# Patient Record
Sex: Female | Born: 1940 | Race: Black or African American | Hispanic: No | Marital: Married | Smoking: Never smoker
Health system: Southern US, Community
[De-identification: ages and names within clinical notes are randomized; demographics above are authoritative.]

## PROBLEM LIST (undated history)

## (undated) ENCOUNTER — Ambulatory Visit: Source: Home / Self Care

## (undated) DIAGNOSIS — R0600 Dyspnea, unspecified: Secondary | ICD-10-CM

## (undated) DIAGNOSIS — E559 Vitamin D deficiency, unspecified: Secondary | ICD-10-CM

## (undated) DIAGNOSIS — M810 Age-related osteoporosis without current pathological fracture: Secondary | ICD-10-CM

## (undated) DIAGNOSIS — D509 Iron deficiency anemia, unspecified: Secondary | ICD-10-CM

## (undated) DIAGNOSIS — I10 Essential (primary) hypertension: Secondary | ICD-10-CM

## (undated) DIAGNOSIS — H269 Unspecified cataract: Secondary | ICD-10-CM

## (undated) DIAGNOSIS — D649 Anemia, unspecified: Secondary | ICD-10-CM

## (undated) DIAGNOSIS — E049 Nontoxic goiter, unspecified: Secondary | ICD-10-CM

## (undated) DIAGNOSIS — E785 Hyperlipidemia, unspecified: Secondary | ICD-10-CM

## (undated) DIAGNOSIS — M79609 Pain in unspecified limb: Secondary | ICD-10-CM

## (undated) DIAGNOSIS — E663 Overweight: Secondary | ICD-10-CM

## (undated) DIAGNOSIS — M542 Cervicalgia: Secondary | ICD-10-CM

## (undated) DIAGNOSIS — N9489 Other specified conditions associated with female genital organs and menstrual cycle: Secondary | ICD-10-CM

## (undated) DIAGNOSIS — R600 Localized edema: Secondary | ICD-10-CM

## (undated) DIAGNOSIS — G4700 Insomnia, unspecified: Secondary | ICD-10-CM

## (undated) DIAGNOSIS — H35039 Hypertensive retinopathy, unspecified eye: Secondary | ICD-10-CM

## (undated) DIAGNOSIS — E7800 Pure hypercholesterolemia, unspecified: Secondary | ICD-10-CM

## (undated) DIAGNOSIS — Z6830 Body mass index (BMI) 30.0-30.9, adult: Secondary | ICD-10-CM

## (undated) HISTORY — DX: Age-related osteoporosis without current pathological fracture: M81.0

## (undated) HISTORY — PX: BREAST BIOPSY: SHX20

## (undated) HISTORY — DX: Overweight: E66.3

## (undated) HISTORY — DX: Hypercalcemia: E83.52

## (undated) HISTORY — PX: REPLACEMENT TOTAL KNEE: SUR1224

## (undated) HISTORY — DX: Hyperlipidemia, unspecified: E78.5

## (undated) HISTORY — DX: Anemia, unspecified: D64.9

## (undated) HISTORY — DX: Pure hypercholesterolemia, unspecified: E78.00

## (undated) HISTORY — DX: Localized edema: R60.0

## (undated) HISTORY — DX: Vitamin D deficiency, unspecified: E55.9

## (undated) HISTORY — DX: Pain in unspecified limb: M79.609

## (undated) HISTORY — DX: Dyspnea, unspecified: R06.00

## (undated) HISTORY — DX: Unspecified cataract: H26.9

## (undated) HISTORY — DX: Nontoxic goiter, unspecified: E04.9

## (undated) HISTORY — DX: Iron deficiency anemia, unspecified: D50.9

## (undated) HISTORY — PX: OTHER SURGICAL HISTORY: SHX169

## (undated) HISTORY — DX: Hypertensive retinopathy, unspecified eye: H35.039

## (undated) HISTORY — DX: Cervicalgia: M54.2

## (undated) HISTORY — DX: Other specified conditions associated with female genital organs and menstrual cycle: N94.89

## (undated) HISTORY — DX: Body mass index (BMI) 30.0-30.9, adult: Z68.30

## (undated) HISTORY — PX: DILATION AND CURETTAGE OF UTERUS: SHX78

## (undated) HISTORY — PX: APPENDECTOMY: SHX54

## (undated) HISTORY — DX: Insomnia, unspecified: G47.00

---

## 1998-03-12 ENCOUNTER — Other Ambulatory Visit: Admission: RE | Admit: 1998-03-12 | Discharge: 1998-03-12 | Payer: Self-pay | Admitting: Obstetrics and Gynecology

## 1998-10-02 ENCOUNTER — Ambulatory Visit (HOSPITAL_COMMUNITY): Admission: RE | Admit: 1998-10-02 | Discharge: 1998-10-02 | Payer: Self-pay | Admitting: Rheumatology

## 1999-03-28 ENCOUNTER — Encounter (HOSPITAL_BASED_OUTPATIENT_CLINIC_OR_DEPARTMENT_OTHER): Payer: Self-pay | Admitting: General Surgery

## 1999-03-28 ENCOUNTER — Ambulatory Visit (HOSPITAL_COMMUNITY): Admission: RE | Admit: 1999-03-28 | Discharge: 1999-03-28 | Payer: Self-pay | Admitting: General Surgery

## 1999-10-23 ENCOUNTER — Encounter (HOSPITAL_BASED_OUTPATIENT_CLINIC_OR_DEPARTMENT_OTHER): Payer: Self-pay | Admitting: General Surgery

## 1999-10-23 ENCOUNTER — Ambulatory Visit (HOSPITAL_COMMUNITY): Admission: RE | Admit: 1999-10-23 | Discharge: 1999-10-23 | Payer: Self-pay | Admitting: General Surgery

## 2001-01-11 ENCOUNTER — Inpatient Hospital Stay (HOSPITAL_COMMUNITY): Admission: AD | Admit: 2001-01-11 | Discharge: 2001-01-16 | Payer: Self-pay | Admitting: Orthopedic Surgery

## 2001-01-11 ENCOUNTER — Encounter: Payer: Self-pay | Admitting: Orthopedic Surgery

## 2001-04-19 ENCOUNTER — Ambulatory Visit (HOSPITAL_COMMUNITY): Admission: RE | Admit: 2001-04-19 | Discharge: 2001-04-19 | Payer: Self-pay | Admitting: Endocrinology

## 2001-04-19 ENCOUNTER — Encounter: Payer: Self-pay | Admitting: Endocrinology

## 2001-05-17 ENCOUNTER — Ambulatory Visit (HOSPITAL_BASED_OUTPATIENT_CLINIC_OR_DEPARTMENT_OTHER): Admission: RE | Admit: 2001-05-17 | Discharge: 2001-05-18 | Payer: Self-pay | Admitting: Orthopedic Surgery

## 2002-01-21 ENCOUNTER — Other Ambulatory Visit: Admission: RE | Admit: 2002-01-21 | Discharge: 2002-01-21 | Payer: Self-pay | Admitting: Obstetrics and Gynecology

## 2002-11-21 ENCOUNTER — Inpatient Hospital Stay (HOSPITAL_COMMUNITY): Admission: RE | Admit: 2002-11-21 | Discharge: 2002-11-28 | Payer: Self-pay | Admitting: Orthopedic Surgery

## 2002-11-21 ENCOUNTER — Encounter: Payer: Self-pay | Admitting: Orthopedic Surgery

## 2003-02-06 ENCOUNTER — Ambulatory Visit (HOSPITAL_BASED_OUTPATIENT_CLINIC_OR_DEPARTMENT_OTHER): Admission: RE | Admit: 2003-02-06 | Discharge: 2003-02-06 | Payer: Self-pay | Admitting: Orthopedic Surgery

## 2003-04-06 ENCOUNTER — Other Ambulatory Visit: Admission: RE | Admit: 2003-04-06 | Discharge: 2003-04-06 | Payer: Self-pay | Admitting: Obstetrics and Gynecology

## 2004-08-13 ENCOUNTER — Other Ambulatory Visit: Admission: RE | Admit: 2004-08-13 | Discharge: 2004-08-13 | Payer: Self-pay | Admitting: Obstetrics and Gynecology

## 2005-10-02 ENCOUNTER — Ambulatory Visit: Payer: Self-pay | Admitting: Obstetrics and Gynecology

## 2005-10-03 ENCOUNTER — Ambulatory Visit (HOSPITAL_COMMUNITY): Admission: RE | Admit: 2005-10-03 | Discharge: 2005-10-03 | Payer: Self-pay | Admitting: *Deleted

## 2006-08-07 ENCOUNTER — Ambulatory Visit: Payer: Self-pay | Admitting: Gynecology

## 2008-06-16 ENCOUNTER — Encounter: Admission: RE | Admit: 2008-06-16 | Discharge: 2008-06-16 | Payer: Self-pay | Admitting: Rheumatology

## 2010-05-29 ENCOUNTER — Emergency Department (HOSPITAL_COMMUNITY): Admission: EM | Admit: 2010-05-29 | Discharge: 2010-05-29 | Payer: Self-pay | Admitting: Emergency Medicine

## 2010-09-11 ENCOUNTER — Other Ambulatory Visit: Admission: RE | Admit: 2010-09-11 | Discharge: 2010-09-11 | Payer: Self-pay | Admitting: Obstetrics and Gynecology

## 2011-03-14 NOTE — Group Therapy Note (Signed)
NAME:  Gloria Whitney, Gloria Whitney NO.:  [id number]   MEDICAL RECORD NO.:  [id number]          PATIENT TYPE:  WOC   LOCATION:  [facility]                   FACILITY:  [facility]   PHYSICIAN:  Argentina Donovan, MD        DATE OF BIRTH:  03/13/1941   DATE OF SERVICE:                                    CLINIC NOTE   HISTORY OF PRESENT ILLNESS:  The patient is a 70 year old, very pleasant  black female who is about 10 years' postmenopausal who went in to the health  department for her annual Pap in a program that they have there, and they  told her that she had a uterus of about 16 weeks' size, and in taking the  history, they told her before when she was seeing a private doctor that the  fibroids were no longer than a thumbnail.  She has been seen on a regular  basis by her own private physicians so we are going and try and get those  records.  It is very possible that they have told her that the uterus was  not growing in the past and that it has been enlarged for many years.  We  will hope to obtain that from the records as well as getting an ultrasound  to evaluate what that shows.  If, in fact, the uterus was small 10 years'  post menopausally, and it has grown to this size then we are significantly  concerned, and we will probably refer the patient to GYN oncologist for  further evaluation.  I have told the patient when the ultrasound and records  from her private physician return we will go ahead and give her a call.           ______________________________  Argentina Donovan, MD     PR/MEDQ  D:  10/02/2005  T:  10/03/2005  Job:  [id number]

## 2011-03-14 NOTE — Discharge Summary (Signed)
[facility]  Patient:    Gloria Whitney, Gloria Whitney                   MRN: [id number] Adm. Date:  [id number] Disc. Date: [id number] Attending:  Milly Jakob Dictator:   Currie Paris. Thedore Mins. CC:         Demetria Pore. Coral Spikes, M.D.   Discharge Summary  ADMISSION DIAGNOSES: 1. Degenerative joint disease right knee. 2. Hypertension.  DISCHARGE DIAGNOSES: 1. Degenerative joint disease right knee. 2. Hypertension. 3. Postoperative blood loss anemia.  PROCEDURES:  Right total knee arthroplasty, Milly Jakob, M.D., January 11, 2001.  HISTORY OF PRESENT ILLNESS:  Gloria Whitney is a pleasant 70 year old female who has a long history of right knee pain.  She has pain with ambulation and positive night pain.  X-rays of the right knee, which are weightbearing films, showed bone-on-bone changes in the lateral compartment.  She got temporary relief with a knee arthroscopy but had persistent pain in her knee despite use of anti-inflammatory medication and cortisone injections.  Since she has not improved with exhaustive conservative management, she was admitted for a right total knee arthroplasty.  LABORATORY DATA:  Hematocrit on admission 33.4, WBC was 4.1.  On postoperative day #1 hemoglobin was 7.9; on postoperative day #2, 8.0; on postoperative day #3, 8.1; on postoperative day #4, 7.9; on postoperative day #5, 8.3.  Protime on admission was 14.68 with an INR of 1.3.  On the date of discharge protime was 17.3 seconds with an INR of 1.7 on Coumadin antithrombolytic therapy. CMET on admission was within normal limits.  One unit of autologous blood was transfused during this hospitalization.  HOSPITAL COURSE:  The patient was admitted to [facility] on January 11, 2001, and brought to the operating room where she underwent a right total knee arthroplasty, as well described in Dr. Luiz Blare operative note. Postoperatively the patient was placed on Ancef 1 g IV q.8h.  x 48 hours, put on a PCA morphine pump for pain control.  Physical therapy was also ordered for walker ambulation, weightbearing as tolerated on the right side.  CPM machine was used for range of motion.  On postoperative day #1 she had no complaints.  Foley catheter had been placed at the time of surgery and was removed.  She had not gotten out of bed yet.  She was using minimal PCA morphine pump.  Her vital signs were stable.  The patient was alert and oriented.  Her hemoglobin was 7.9.  Her protime was 14.68 with an INR of 1.3. She was felt to have postoperative blood loss anemia, and one unit of autologous blood was transfused.  The patient was seen by physical therapy. Her Norvasc was held until her blood pressure increased.  She was continued on IV fluids, PCA morphine pump, and Coumadin antithrombolytic therapy per pharmacy protocol.  We watched her hemoglobin carefully.  On postoperative day #2 her hemoglobin was 8.0.  Her INR was 1.4.  ______  was getting along quite well.  She had no dizziness when up.  She was put on oral iron therapy.  Her dressing was changed, and her Hemovac drain was pulled.  Her IV and PCA morphine pump were discontinued.  She continued to progress without difficulties.  We watched her hemoglobin carefully and, on the date of discharge, her hemoglobin was 8.3.  Her vital signs were stable, and she was afebrile.  Her right knee wound was benign.  Her calf  was soft.  She was discharged home.  She was given a prescription for Percocet p.r.n. for pain, 1-2 q.6h., with no refills.  Coumadin per pharmacy protocol x 1 month postoperatively.  She will get home health physical therapy and home health R.N. blood draws for protimes.  She will follow up with Dr. Luiz Blare in 10 days in the office, sooner if any problems occur.DD:  02/17/01 TD:  02/19/01 Job: [id number]

## 2011-03-14 NOTE — Op Note (Signed)
   NAME:  Gloria Whitney, Gloria Whitney                      ACCOUNT NO.:  [id number]   MEDICAL RECORD NO.:  [id number]                   PATIENT TYPE:  AMB   LOCATION:  [facility]                                  FACILITY:  [facility]   PHYSICIAN:  Harvie Junior, M.D.                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   PROCEDURE:  Left total knee manipulation.   SURGEON:  Harvie Junior, M.D.   ASSISTANT:  None.   ANESTHESIA:  General.   HISTORY OF PRESENT ILLNESS:  The patient is a 70 year old female with a  history of having had a knee replacement followed by knee revision. She  ultimately ended up having problems with stiffness postoperatively.  Ultimately, we attempted a manipulation on her and she was brought to the  operating room for the procedure.   DESCRIPTION OF PROCEDURE:  The patient was brought to the operating room and  after adequate anesthesia was obtained with general anesthetic, the patient  was placed on the operating table. The left leg was then attempted to be  manipulated.  We ultimately were able to manipulate her from about 70 to  about 85 degrees, but really did not feel the breakup of scar tissue.  It  was more of a springy manipulation and the improvements were really pretty  minimal. At that point we tried to assess where the tightness was.  It  seemed to really be more anterior than anywhere.  We had concerns of ripping  the patellar tendon and had concerns over attempting to go too aggressively  with this and at this point felt that our only options were to abandon  further attempts at manipulation.  The patient was then allowed to awaken  and taken to the recovery room where she was noted to be in satisfactory  condition.  Estimated  blood loss for the procedure was none.  The knee was  instilled with 20 mL of 1/2% Marcaine postoperatively.                                               Harvie Junior, M.D.    Ranae Plumber  D:  02/06/2003  T:  02/06/2003  Job:  [id number]

## 2011-03-14 NOTE — Op Note (Signed)
NAME:  Gloria Whitney, Gloria Whitney                      ACCOUNT NO.:  [id number]   MEDICAL RECORD NO.:  [id number]                   PATIENT TYPE:  INP   LOCATION:  NA                                   FACILITY:  [facility]   PHYSICIAN:  Harvie Junior, M.D.                DATE OF BIRTH:  1941/04/12   DATE OF PROCEDURE:  11/21/2002  DATE OF DISCHARGE:                                 OPERATIVE REPORT   PREOPERATIVE DIAGNOSIS:  Painful right total knee with stiffness and concern  for looseness based on radiographic findings and bone scan findings.   POSTOPERATIVE DIAGNOSIS:  1. Loose tibial component.  2. Tightness of flexion with a severe amount of synovial tissue and     tightness of the extensor mechanism.   PROCEDURE:  1. Revision of tibial component, right femur.  2. Revision of patellar component, right femur.  3. Repair of extensor mechanism by way of suture anchor on the patellar     tendon.   SURGEON:  Harvie Junior, M.D.   ASSISTANT:  Marshia Ly, P.A.   ANESTHESIA:  General.   INDICATIONS:  The patient is a 70 year old female with a long history of  having  had a right total knee replacement for degenerative changes in the  knee. She ultimately initially did well. Postoperatively she had some  injuries about the knee but ultimately became increasingly stiff and  increasingly painful about the knee. Because of continued complaints of pain  and stiffness, the patient ultimately underwent a bone scan. The bone scan  suggested that the tibial component was loose as well as questioning the  situation of the femoral component.   After discussion of the possibility of the issues related to the knee, the  patient ultimately elected to undergo a revision of the knee as needed and  she was brought to the operating room for this procedure. During the period  of time when we had discussed revision and the actual surgery, the patient  began having  increasing stiffness, and ultimately  by the time of surgery  had a range of motion of about 5 to 50 degrees, and this was noted prior to  the time of her revision. The patient is brought to the operating room for a  total knee revision as needed.   DESCRIPTION OF PROCEDURE:  The patient was brought to the operating room.  After adequate general anesthesia was obtained with a general anesthetic the  patient was placed on the operating table and the right leg was prepped and  draped in the usual sterile fashion. Following Esmarch exsanguination, the  tourniquet was placed and inflated to 350 mmHg.   Following this an incision was made, an excision of her old scar and this  incision was continued down to the extensor mechanism. The extensor  mechanism was then clearly identified, and a medial parapatellar arthrotomy  was undertaken. At this point there  was a tremendous amount of scar tissue  socking in the patellar tendon, socking in the medial and lateral sides of  the component, and it was felt that at this point the most appropriate  course of action was going to be radical synovectomy.   This was started proximally on both the medial and lateral side, and a  massive synovectomy was performed. There was still inability to evert the  patella at this point, and attention was turned towards continuing to do  this. Ultimately a pin was put in place to hold the extensor mechanism and  the patella was ultimately everted and then flexed up. The extensor  mechanism then did rip slightly at the insertion area of this pin. The  extensor mechanism did not come disconnected, but it was somewhat moved by  the insertion site of this pin.   At this point the tibial component was assessed. It did not appear to be  grossly loose, but there was some wobble, given the bone scan findings and  the minimal looseness. Ultimately an osteotome was placed under the side and  then the tibial component did come quickly loose.   At this point  attention was turned towards the femoral side where a femoral  inserter was put in place around the femoral component. It was rocked, it  was slapped and did not appear to be loose at all.   At this point attention was turned to the tibia where the tibial shaft was  sequentially reamed up to a level of 20. Conical reaming was then  undertaken. Following this attention was turned toward sizing the tibial  component. It was between a 3 and a 4, but ultimately felt a 3 was going to  be the most appropriate area, and obviously the stem was going to dictate  the location of the tibial component at any rate.   At this point attention was turned towards placement of the trial tibia. It  was reamed and then conically reamed and then the component was put in  place. The trial of 15 mm insert was put in place and then the patella was  left in place. A range of motion was undertaken and there was really  significant tightness in terms of the extensor mechanism at this point. It  was felt that we could gain 4 to 5 mm of space by changing to an all poly  patella and at this point the decision was made to revise the patellar  component.   An osteotome was used to get the patellar component removed from the patella  and this was then free hand cut down to a level of 13 mm, and then free hand  drilled for a 35 mm patella. Range of motion was significantly improved by  this, but still there was tightness of the extensor mechanism.   At this point attention was turned towards the extensor mechanism, where a  radical lateral retinacular release was performed. This improved the flexion  significantly and the tightness was relieved significantly by this.   At this point all the components were removed. It was copiously irrigated  and suctioned dry with pulsatile lavage irrigation. Stat Gram stains had been sent during the case and were not available at this point, but there  did not appear to be any  evidence of gross infection.   At this point we called down to the laboratory prior to  implantation and it  turned out that the labs had  not been sent off as requested in a stat  fashion and it was clear they were not going to be available prior to the  end of this case. We felt at this point it was appropriate to cement the  tibial component and patellar component in as there were no gross signs of  infection.   The femoral and tibial components were then cemented into place. A 20 mm  stemmed tibial component as well as a conical portion with a tray and a 15  mm insert. The patella was drilled and cemented and a 35 mm patellar button.  The leg was put through a range of motion at this point and felt to be  stable in all directions. It did come easily at this point to gravity 115  degrees of flexion.  A radical synovectomy was again checked and it appeared as tough the  synovial proliferation had been addressed.   At this point the leg was copiously irrigated. The lateral retinacular  release was performed. The tourniquet was let down at this time. All  bleeding was controlled with electrocautery.   Attention was then turned towards closure. In 90 degrees of flexion the  extensor mechanism was closed both proximally and distally. The skin was  then closed with a combination of 0 and 2-0 Vicryl. A medium Hemovac drain  had been previously  put in place and was hooked up, and the leg was put  through a range of motion at this point with easy 115 degrees of gravity  flexion and easily came out to full extension.   At this point attention was turned towards the extensor mechanism in the  area where the extensor mechanism had torn loose. Any repair  of the  extensor mechanism was undertaken with a suture anchor. A 3.5 mm suture  anchor with a super quick attachment was used and the patellar tendon was  repaired in place. This was done with the knee in 90 degrees of flexion and  no  tendency towards tension on the patellar tendon at this point.   At this point again the knee was copiously irrigated and suctioned dry. The  Hemovac drain was hooked up and the closure was taken in flexion of the  medial parapatellar arthrotomy and the skin, and the staples were applied.   The patient was then taken to the recovery room where she was noted to be in  satisfactory condition after a sterile compressive dressing had been applied  and a knee immobilizer. The estimated blood loss for the case was 200 cc.                                               Harvie Junior, M.D.    Ranae Plumber  D:  11/21/2002  T:  11/21/2002  Job:  [id number]

## 2011-03-14 NOTE — Discharge Summary (Signed)
NAME:  Gloria Whitney, Gloria Whitney                      ACCOUNT NO.:  [id number]   MEDICAL RECORD NO.:  [id number]                   PATIENT TYPE:  INP   LOCATION:  5010                                 FACILITY:  [facility]   PHYSICIAN:  Harvie Junior, M.D.                DATE OF BIRTH:  05-25-1941   DATE OF ADMISSION:  11/21/2002  DATE OF DISCHARGE:  11/28/2002                                 DISCHARGE SUMMARY   ADMISSION DIAGNOSES:  1. Aseptic loosening of tibial component, right knee, status post right     total knee arthroplasty.  2. Hypertension.   DISCHARGE DIAGNOSES:  1. 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  2. Hypertension.  3. Postoperative blood loss anemia.   PROCEDURES:  Right total knee revision with revision of tibial component and  patellar component with extensive synovectomy/scar tissue removal.   CONSULTATIONS:  [facility] consult with Francisca December, M.D.   HISTORY OF PRESENT ILLNESS:  The patient is a 70 year old female who  underwent a right total knee replacement in 2002.  Postoperatively she did  relatively well and was able to function and get back to work, but had  worsening pain and swelling in her right knee.  She had pain with  ambulation.  She had plain x-rays which showed questionable tibial component  loosening radiographically.  We obtained a bone scan of the right knee which  showed increased uptake in the knee which was felt to be indicative of  probable loosening of her tibial component.  Her sedimentation rate was 31  and her CPR was 0.2.  We modified her activity and treated her with  medication, but she continued to have problems with pain in her knee and was  admitted for right total knee revision as she failed to respond to  exhaustive conservative treatment.   LABORATORY DATA:  The patient's EKG on admission showed normal sinus rhythm  with  nonspecific T-wave abnormality.  Preoperative chest x-ray showed stable  chest with no active findings.  The patient's hemoglobin on admission was  12.4 with a hematocrit of 36.2.  The hemoglobin on postoperative day #1 was  8.4.  On postoperative day #2, it was 10.2.  On postoperative day #3, it was  8.8.  On postoperative day #5, it was 8.4.  Her indices were within normal  limits.  The pro time on admission was 12 seconds with an INR of 0.8 and a  PTT of 30.  On the day of discharge, her pro time was 19.1 seconds with an  INR of 1.7.  The CMET on admission was within normal limits, although she  had a slightly elevated AST at 40.  The BMET on postoperative day #1 showed  a sodium of 132 and a glucose of 123, otherwise within normal limits.  The  urinalysis on admission  showed no abnormalities.  Cultures from the right  knee showed no growth in two days.  Gram's stain showed no organisms.  There  were no anaerobes isolated.  AFB culture and smears showed no acid-fast  bacilli seen.  The patient was transfused two units of packed rbc's during  this hospitalization for decreased hemoglobin.   HOSPITAL COURSE:  The patient underwent right total knee revision as well  described in Dr. Harvie Junior' operative note on November 21, 2002.  Postoperatively she was put on Ancef 1 g IV q.8h. x 6 doses.  A PCA morphine  pump was used for pain control.  She was also started on physical therapy  and a CPM machine was used for her right knee to maintain an increased range  of motion.  On postoperative day #1, she had a somewhat low blood pressure  at 99/65.  Her fluid was increased up.  She was anemic with a hemoglobin of  8.4 and a hematocrit of 24.8.  Her INR was 1.1.  Two units of packed red  blood cells were transfused.  Her Foley catheter which had been placed at  the time of surgery was maintained.  An [facility] consult was  obtained on November 22, 2002.  They felt that she had nonanginal  epigastric  and abdominal pain.  Hypertension was controlled.  They felt that the  hypotension may be related to the blood loss.  They did not feel there was  any sort of cardiac event which required significant treatment.  On  postoperative day #2, she had no complaints.  She had minimal knee pain.  Her Foley catheter was removed.  She had not gotten out of bed.  She had no  dizziness or chest pain.  She had a temperature of 101.7 degrees and her  vital signs were stable.  Her hemoglobin was 10.2 and her INR was 1.5.  Her  dressing was changed to the right knee and her Hemovac drains were pulled.  Her PCA morphine pump was left an additional day to help control her pain.  On postoperative day #3, she had a temperature up to 103.2 degrees and then  was found to be afebrile.  Her knee wound was benign.  Her calf was soft  with no sign of DVT.  A rehabilitation consult was obtained, but her  insurance did not cover this, so this was aborted.  On postoperative day #4,  she had a sensation of feeling weak.  She had some mild rectal bleeding on  her toilet.  This was not felt to be significant.  Lovenox was added at 30  mg subcu b.i.d. as her INR was only 1.3.  She was continued with physical  therapy and the use of the CPM machine.  She made somewhat slow progress  with physical therapy and was still able to get home and be independent  until November 28, 2002.  At that point, she continued to feel weak, but she  was overall ready for discharge.  Her vital signs were stable.  She was  afebrile.  Her right knee wound was benign.  Her calf was soft.  Her  neurovascular status was back distally.   DISPOSITION:  She was discharged home in improved condition.   DIET:  She was on a regular diet.   DISCHARGE MEDICATIONS:  We will continue her usual home medications with the  additional of Percocet 5 mg p.r.n. for pain and Coumadin antithrombolytic therapy per  pharmacy protocol x 1 month  postoperatively.   ACTIVITY:  She will ambulate with weightbearing as tolerated on the right  with a walker.  She will bring home a CPM machine and get home health  physical therapy as well.   FOLLOW-UP:  She will follow up with Harvie Junior, M.D., in one week in the  office.  She will call sooner if any problems occur.     Marshia Ly, P.A.                       Harvie Junior, M.D.    Cordelia Pen  D:  12/28/2002  T:  12/28/2002  Job:  [id number]   cc:   Demetria Pore. Coral Spikes, M.D.  [address]  [address]  [place]  [place] [address]  Fax: [phone]   Francisca December, M.D.  [address]  [address]  [place]  [place] [address]  Fax: [phone]
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
## 2011-09-04 DIAGNOSIS — Z96659 Presence of unspecified artificial knee joint: Secondary | ICD-10-CM | POA: Insufficient documentation

## 2012-10-15 ENCOUNTER — Other Ambulatory Visit (HOSPITAL_COMMUNITY)
Admission: RE | Admit: 2012-10-15 | Discharge: 2012-10-15 | Disposition: A | Discharge status: Home / Self Care | Payer: Medicare Other | Source: Ambulatory Visit | Attending: Obstetrics and Gynecology | Admitting: Obstetrics and Gynecology

## 2012-10-15 ENCOUNTER — Other Ambulatory Visit: Payer: Self-pay | Admitting: Obstetrics and Gynecology

## 2012-10-15 DIAGNOSIS — Z124 Encounter for screening for malignant neoplasm of cervix: Secondary | ICD-10-CM | POA: Insufficient documentation

## 2013-11-23 ENCOUNTER — Other Ambulatory Visit: Payer: Self-pay | Admitting: Obstetrics and Gynecology

## 2013-11-23 DIAGNOSIS — D259 Leiomyoma of uterus, unspecified: Secondary | ICD-10-CM

## 2013-11-29 ENCOUNTER — Other Ambulatory Visit: Payer: Medicare Other

## 2015-01-24 DIAGNOSIS — M810 Age-related osteoporosis without current pathological fracture: Secondary | ICD-10-CM | POA: Insufficient documentation

## 2015-01-24 DIAGNOSIS — M4850XA Collapsed vertebra, not elsewhere classified, site unspecified, initial encounter for fracture: Secondary | ICD-10-CM | POA: Insufficient documentation

## 2015-01-24 DIAGNOSIS — Z9181 History of falling: Secondary | ICD-10-CM | POA: Insufficient documentation

## 2015-01-24 DIAGNOSIS — R29890 Loss of height: Secondary | ICD-10-CM | POA: Insufficient documentation

## 2015-01-24 DIAGNOSIS — R42 Dizziness and giddiness: Secondary | ICD-10-CM | POA: Insufficient documentation

## 2015-07-23 ENCOUNTER — Other Ambulatory Visit: Payer: Self-pay | Admitting: Internal Medicine

## 2015-07-23 DIAGNOSIS — R42 Dizziness and giddiness: Secondary | ICD-10-CM

## 2015-07-24 ENCOUNTER — Ambulatory Visit
Admission: RE | Admit: 2015-07-24 | Discharge: 2015-07-24 | Disposition: A | Discharge status: Home / Self Care | Payer: Medicare Other | Source: Ambulatory Visit | Attending: Internal Medicine | Admitting: Internal Medicine

## 2015-07-24 DIAGNOSIS — R42 Dizziness and giddiness: Secondary | ICD-10-CM

## 2016-08-13 DIAGNOSIS — Z8781 Personal history of (healed) traumatic fracture: Secondary | ICD-10-CM | POA: Insufficient documentation

## 2017-12-30 DIAGNOSIS — M1712 Unilateral primary osteoarthritis, left knee: Secondary | ICD-10-CM | POA: Insufficient documentation

## 2018-02-16 DIAGNOSIS — Z5181 Encounter for therapeutic drug level monitoring: Secondary | ICD-10-CM | POA: Insufficient documentation

## 2019-02-10 ENCOUNTER — Other Ambulatory Visit: Payer: Self-pay

## 2019-02-10 ENCOUNTER — Emergency Department (HOSPITAL_COMMUNITY): Payer: Medicare Other

## 2019-02-10 ENCOUNTER — Encounter (HOSPITAL_COMMUNITY): Payer: Self-pay

## 2019-02-10 ENCOUNTER — Inpatient Hospital Stay (HOSPITAL_COMMUNITY)
Admission: EM | Admit: 2019-02-10 | Discharge: 2019-02-13 | DRG: 418 | Disposition: A | Discharge status: Home Health | Payer: Medicare Other | Attending: General Surgery | Admitting: General Surgery

## 2019-02-10 DIAGNOSIS — Z9851 Tubal ligation status: Secondary | ICD-10-CM | POA: Diagnosis not present

## 2019-02-10 DIAGNOSIS — Z79899 Other long term (current) drug therapy: Secondary | ICD-10-CM

## 2019-02-10 DIAGNOSIS — Z888 Allergy status to other drugs, medicaments and biological substances status: Secondary | ICD-10-CM

## 2019-02-10 DIAGNOSIS — K810 Acute cholecystitis: Secondary | ICD-10-CM | POA: Diagnosis present

## 2019-02-10 DIAGNOSIS — I10 Essential (primary) hypertension: Secondary | ICD-10-CM | POA: Diagnosis present

## 2019-02-10 DIAGNOSIS — Z419 Encounter for procedure for purposes other than remedying health state, unspecified: Secondary | ICD-10-CM

## 2019-02-10 DIAGNOSIS — E871 Hypo-osmolality and hyponatremia: Secondary | ICD-10-CM | POA: Diagnosis present

## 2019-02-10 DIAGNOSIS — N179 Acute kidney failure, unspecified: Secondary | ICD-10-CM | POA: Diagnosis present

## 2019-02-10 DIAGNOSIS — R1011 Right upper quadrant pain: Secondary | ICD-10-CM | POA: Diagnosis present

## 2019-02-10 DIAGNOSIS — Z96659 Presence of unspecified artificial knee joint: Secondary | ICD-10-CM | POA: Diagnosis present

## 2019-02-10 HISTORY — DX: Essential (primary) hypertension: I10

## 2019-02-10 LAB — CBC WITH DIFFERENTIAL/PLATELET
Abs Immature Granulocytes: 0.1 10*3/uL — ABNORMAL HIGH (ref 0.00–0.07)
Basophils Absolute: 0 10*3/uL (ref 0.0–0.1)
Basophils Relative: 0 %
Eosinophils Absolute: 0 10*3/uL (ref 0.0–0.5)
Eosinophils Relative: 0 %
HCT: 35.9 % — ABNORMAL LOW (ref 36.0–46.0)
Hemoglobin: 12.1 g/dL (ref 12.0–15.0)
Immature Granulocytes: 1 %
Lymphocytes Relative: 7 %
Lymphs Abs: 1 10*3/uL (ref 0.7–4.0)
MCH: 31.7 pg (ref 26.0–34.0)
MCHC: 33.7 g/dL (ref 30.0–36.0)
MCV: 94 fL (ref 80.0–100.0)
Monocytes Absolute: 1.6 10*3/uL — ABNORMAL HIGH (ref 0.1–1.0)
Monocytes Relative: 11 %
Neutro Abs: 11.9 10*3/uL — ABNORMAL HIGH (ref 1.7–7.7)
Neutrophils Relative %: 81 %
Platelets: 189 10*3/uL (ref 150–400)
RBC: 3.82 MIL/uL — ABNORMAL LOW (ref 3.87–5.11)
RDW: 12.1 % (ref 11.5–15.5)
WBC: 14.6 10*3/uL — ABNORMAL HIGH (ref 4.0–10.5)
nRBC: 0 % (ref 0.0–0.2)

## 2019-02-10 LAB — COMPREHENSIVE METABOLIC PANEL
ALT: 60 U/L — ABNORMAL HIGH (ref 0–44)
AST: 63 U/L — ABNORMAL HIGH (ref 15–41)
Albumin: 3.5 g/dL (ref 3.5–5.0)
Alkaline Phosphatase: 77 U/L (ref 38–126)
Anion gap: 12 (ref 5–15)
BUN: 14 mg/dL (ref 8–23)
CO2: 23 mmol/L (ref 22–32)
Calcium: 9.6 mg/dL (ref 8.9–10.3)
Chloride: 92 mmol/L — ABNORMAL LOW (ref 98–111)
Creatinine, Ser: 1.35 mg/dL — ABNORMAL HIGH (ref 0.44–1.00)
GFR calc Af Amer: 44 mL/min — ABNORMAL LOW (ref >60)
GFR calc non Af Amer: 38 mL/min — ABNORMAL LOW (ref >60)
Glucose, Bld: 122 mg/dL — ABNORMAL HIGH (ref 70–99)
Potassium: 3.5 mmol/L (ref 3.5–5.1)
Sodium: 127 mmol/L — ABNORMAL LOW (ref 135–145)
Total Bilirubin: 1.3 mg/dL — ABNORMAL HIGH (ref 0.3–1.2)
Total Protein: 7.5 g/dL (ref 6.5–8.1)

## 2019-02-10 LAB — URINALYSIS, ROUTINE W REFLEX MICROSCOPIC
Bacteria, UA: NONE SEEN
Bilirubin Urine: NEGATIVE
Glucose, UA: NEGATIVE mg/dL
Ketones, ur: NEGATIVE mg/dL
Leukocytes,Ua: NEGATIVE
Nitrite: NEGATIVE
Protein, ur: NEGATIVE mg/dL
Specific Gravity, Urine: 1.031 — ABNORMAL HIGH (ref 1.005–1.030)
pH: 6 (ref 5.0–8.0)

## 2019-02-10 LAB — LIPASE, BLOOD: Lipase: 34 U/L (ref 11–51)

## 2019-02-10 MED ORDER — IOPAMIDOL (ISOVUE-300) INJECTION 61%
75.0000 mL | Freq: Once | INTRAVENOUS | Status: AC | PRN
  Administered 2019-02-10: 75 mL via INTRAVENOUS

## 2019-02-10 MED ORDER — DIPHENHYDRAMINE HCL 50 MG/ML IJ SOLN: 12.5000 mg | Freq: Four times a day (QID) | INTRAMUSCULAR | Status: DC | PRN

## 2019-02-10 MED ORDER — ONDANSETRON 4 MG PO TBDP: 4.0000 mg | ORAL_TABLET | Freq: Four times a day (QID) | ORAL | Status: DC | PRN

## 2019-02-10 MED ORDER — DOCUSATE SODIUM 100 MG PO CAPS
100.0000 mg | ORAL_CAPSULE | Freq: Two times a day (BID) | ORAL | Status: DC
  Administered 2019-02-10 – 2019-02-13 (×6): 100 mg via ORAL
  Filled 2019-02-10 (×6): qty 1

## 2019-02-10 MED ORDER — MORPHINE SULFATE (PF) 2 MG/ML IV SOLN
2.0000 mg | INTRAVENOUS | Status: DC | PRN
  Administered 2019-02-11 (×2): 2 mg via INTRAVENOUS
  Filled 2019-02-10 (×2): qty 1

## 2019-02-10 MED ORDER — ONDANSETRON HCL 4 MG/2ML IJ SOLN
4.0000 mg | Freq: Once | INTRAMUSCULAR | Status: AC
  Administered 2019-02-10: 10:00:00 4 mg via INTRAVENOUS
  Filled 2019-02-10: qty 2

## 2019-02-10 MED ORDER — ACETAMINOPHEN 325 MG PO TABS
650.0000 mg | ORAL_TABLET | Freq: Four times a day (QID) | ORAL | Status: DC
  Administered 2019-02-10 – 2019-02-13 (×10): 650 mg via ORAL
  Filled 2019-02-10 (×10): qty 2

## 2019-02-10 MED ORDER — PANTOPRAZOLE SODIUM 40 MG IV SOLR
40.0000 mg | Freq: Every day | INTRAVENOUS | Status: DC
  Administered 2019-02-10 – 2019-02-11 (×2): 40 mg via INTRAVENOUS
  Filled 2019-02-10 (×2): qty 40

## 2019-02-10 MED ORDER — DIPHENHYDRAMINE HCL 12.5 MG/5ML PO ELIX: 12.5000 mg | ORAL_SOLUTION | Freq: Four times a day (QID) | ORAL | Status: DC | PRN

## 2019-02-10 MED ORDER — AMLODIPINE BESYLATE 5 MG PO TABS
5.0000 mg | ORAL_TABLET | Freq: Every day | ORAL | Status: DC
  Administered 2019-02-11 – 2019-02-13 (×3): 5 mg via ORAL
  Filled 2019-02-10 (×3): qty 1

## 2019-02-10 MED ORDER — HYDRALAZINE HCL 20 MG/ML IJ SOLN: 10.0000 mg | INTRAMUSCULAR | Status: DC | PRN

## 2019-02-10 MED ORDER — OXYCODONE HCL 5 MG PO TABS
5.0000 mg | ORAL_TABLET | Freq: Four times a day (QID) | ORAL | Status: DC | PRN
  Administered 2019-02-11 – 2019-02-12 (×2): 5 mg via ORAL
  Filled 2019-02-10: qty 1

## 2019-02-10 MED ORDER — SODIUM CHLORIDE 0.9 % IV SOLN
INTRAVENOUS | Status: DC
  Administered 2019-02-10: 10:00:00 via INTRAVENOUS

## 2019-02-10 MED ORDER — ONDANSETRON HCL 4 MG/2ML IJ SOLN: 4.0000 mg | Freq: Four times a day (QID) | INTRAMUSCULAR | Status: DC | PRN

## 2019-02-10 MED ORDER — SODIUM CHLORIDE 0.9 % IV SOLN
2.0000 g | INTRAVENOUS | Status: DC
  Administered 2019-02-10 – 2019-02-12 (×3): 2 g via INTRAVENOUS
  Filled 2019-02-10 (×4): qty 20

## 2019-02-10 MED ORDER — POTASSIUM CHLORIDE IN NACL 20-0.9 MEQ/L-% IV SOLN
INTRAVENOUS | Status: DC
  Administered 2019-02-10: 19:00:00 via INTRAVENOUS
  Filled 2019-02-10: qty 1000

## 2019-02-10 MED ORDER — HYDROCHLOROTHIAZIDE 25 MG PO TABS
12.5000 mg | ORAL_TABLET | Freq: Every day | ORAL | Status: DC
  Administered 2019-02-12 – 2019-02-13 (×2): 12.5 mg via ORAL
  Filled 2019-02-10 (×2): qty 1

## 2019-02-10 NOTE — ED Provider Notes (Signed)
[place] EMERGENCY DEPARTMENT Provider Note   CSN: [id number] Arrival date & time: 02/10/19  1002    History   Chief Complaint Chief Complaint  Patient presents with  . Abdominal Pain    HPI Gloria Whitney is a 78 y.o. female.     HPI Patient presents with abdominal pain, nausea, anorexia. Onset was perhaps a few weeks ago, and symptoms were transiently better with antacid therapy. However, the patient stopped Tagamet therapy, had a recurrence of her pain, which is now moderate, nonradiating, upper abdominal, with associated fever. After speaking with her physician yesterday via telemedicine, she was advised that if her symptoms persisted she should come for evaluation. She states that she is a generally healthy lady, has no clear precipitating factor, has a history of hypertension, takes medication regularly, but otherwise no substantial medical problems.  Past Medical History:  Diagnosis Date  . Hypertension     There are no active problems to display for this patient.   History reviewed. No pertinent surgical history.   OB History   No obstetric history on file.      Home Medications    Prior to Admission medications   Medication Sig Start Date End Date Taking? Authorizing Provider  amLODipine (NORVASC) 5 MG tablet Take 5 mg by mouth daily.   Yes [provider]  hydrochlorothiazide (HYDRODIURIL) 12.5 MG tablet Take 12.5 mg by mouth daily.   Yes [provider]  omeprazole (PRILOSEC OTC) 20 MG tablet Take 20 mg by mouth daily.   Yes [provider]    Family History History reviewed. No pertinent family history.  Social History Social History   Tobacco Use  . Smoking status: Never Smoker  . Smokeless tobacco: Never Used  Substance Use Topics  . Alcohol use: Never    Frequency: Never  . Drug use: Not on file     Allergies   Iodine   Review of Systems Review of Systems  Constitutional:        Per HPI, otherwise negative  HENT:       Per HPI, otherwise negative  Respiratory:       Per HPI, otherwise negative  Cardiovascular:       Per HPI, otherwise negative  Gastrointestinal: Positive for abdominal pain, nausea and vomiting.  Endocrine:       Negative aside from HPI  Genitourinary:       Neg aside from HPI   Musculoskeletal:       Per HPI, otherwise negative  Skin: Negative.   Neurological: Positive for weakness. Negative for syncope.     Physical Exam Updated Vital Signs BP 118/73   Pulse (!) 101   Temp (!) 100.8 F (38.2 C) (Oral)   Resp 18   Ht 5\' 2"  (1.575 m)   Wt 78 kg   SpO2 95%   BMI 31.46 kg/m   Physical Exam Vitals signs and nursing note reviewed.  Constitutional:      General: She is not in acute distress.    Appearance: She is well-developed.  HENT:     Head: Normocephalic and atraumatic.  Eyes:     Conjunctiva/sclera: Conjunctivae normal.  Cardiovascular:     Rate and Rhythm: Normal rate and regular rhythm.  Pulmonary:     Effort: Pulmonary effort is normal. No respiratory distress.     Breath sounds: Normal breath sounds. No stridor.  Abdominal:     General: There is no distension.  Tenderness: There is abdominal tenderness in the epigastric area. There is guarding.  Skin:    General: Skin is warm and dry.  Neurological:     Mental Status: She is alert and oriented to person, place, and time.     Cranial Nerves: No cranial nerve deficit.      ED Treatments / Results  Labs (all labs ordered are listed, but only abnormal results are displayed) Labs Reviewed  COMPREHENSIVE METABOLIC PANEL - Abnormal; Notable for the following components:      Result Value   Sodium 127 (*)    Chloride 92 (*)    Glucose, Bld 122 (*)    Creatinine, Ser 1.35 (*)    AST 63 (*)    ALT 60 (*)    Total Bilirubin 1.3 (*)    GFR calc non Af Amer 38 (*)    GFR calc Af Amer 44 (*)    All other components within normal limits  CBC WITH  DIFFERENTIAL/PLATELET - Abnormal; Notable for the following components:   WBC 14.6 (*)    RBC 3.82 (*)    HCT 35.9 (*)    Neutro Abs 11.9 (*)    Monocytes Absolute 1.6 (*)    Abs Immature Granulocytes 0.10 (*)    All other components within normal limits  LIPASE, BLOOD  URINALYSIS, ROUTINE W REFLEX MICROSCOPIC    EKG None  Radiology Ct Abdomen Pelvis W Contrast  Result Date: 02/10/2019 CLINICAL DATA:  Abdominal pain. EXAM: CT ABDOMEN AND PELVIS WITH CONTRAST TECHNIQUE: Multidetector CT imaging of the abdomen and pelvis was performed using the standard protocol following bolus administration of intravenous contrast. CONTRAST:  62mL ISOVUE-300 IOPAMIDOL (ISOVUE-300) INJECTION 61% COMPARISON:  None. FINDINGS: Lower chest: There is slight linear atelectasis or scarring at both lung bases. Heart size is normal. Aortic atherosclerosis. Hepatobiliary: The gallbladder is markedly distended. Gallbladder wall is thickened with pericholecystic soft tissue stranding and edema with a small amount of fluid in the right pericolic gutter inferior to the inflamed gallbladder. There multiple noncalcified stones in the gallbladder. No dilated bile ducts. Liver parenchyma is normal. Pancreas: Unremarkable. No pancreatic ductal dilatation or surrounding inflammatory changes. Spleen: Normal in size without focal abnormality. Adrenals/Urinary Tract: Normal appearing adrenal glands. 4 mm stone in the upper pole of the otherwise normal left kidney. Normal right kidney. The bladder appears normal except for compression by the enlarged uterus. No hydronephrosis. Stomach/Bowel: Extensive diverticulosis of the colon, primarily involving the descending portion of the colon. The cecum lies in the midline. The appendix is not identified. Stomach appears normal. Vascular/Lymphatic: Aortic atherosclerosis. No enlarged abdominal or pelvic lymph nodes. Reproductive: The uterus is markedly enlarged with marked distention of the  endometrial cavity with inhomogeneous density within the distended cavity. The cervix is enlarged with inhomogeneous enhancement as well as some dystrophic calcifications in the region of the cervix. The uterus measures 14 x 12 x 12 cm. No discrete right adnexal mass. Dense benign calcifications in the left adnexa may be associated with an atrophic left ovary. Other: There is a small amount of fluid in the right pericolic gutter with a tiny amount of fluid in the cul-de-sac. Tiny periumbilical hernia containing only fat. Musculoskeletal: No acute abnormality. 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 IMPRESSION: 1. Findings consistent acute cholecystitis. Prominent soft tissue stranding and a small amount of fluid around the distended gallbladder. 2. Marked distention of the endometrial cavity of the uterus. 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 Gyn referral with possible tissue sampling is recommended. 3. Extensive colonic diverticulosis. 4.  Aortic Atherosclerosis (ICD10-I70.0). Electronically Signed   By: Lorriane Shire M.D.   On: 02/10/2019 12:44    Procedures Procedures (including critical care time)  Medications Ordered in ED Medications  0.9 %  sodium chloride infusion ( Intravenous New Bag/Given 02/10/19 1020)  ondansetron (ZOFRAN) injection 4 mg (4 mg Intravenous Given 02/10/19 1020)  iopamidol (ISOVUE-300) 61 % injection 75 mL (75 mLs Intravenous Contrast Given 02/10/19 1205)     Initial Impression / Assessment and Plan / ED Course  I have reviewed the triage vital signs and the nursing notes.  Pertinent labs & imaging results that were available during my care of the patient were reviewed by me and considered in my medical decision making (see chart for details).    Update: patient in similar condition.    3:26 PM Patient in no distress, aware of all findings, including incidental abnormalities in the  reproductive area. Patient states that she has been following up with her gynecologist, will do so as needed.  Elderly female presents with what was initially colicky abdominal pain, nausea, vomiting, now with persistency, creasing discomfort in the upper abdomen, nausea, vomiting. Patient is awake and alert, no evidence for bacteremia, sepsis, but with findings concerning for acute cholecystitis. After discussed the patient's case with our surgical colleagues she was admitted for further evaluation, monitoring, management.  Final Clinical Impressions(s) / ED Diagnoses   Final diagnoses:  Acute cholecystitis     Carmin Muskrat, MD 02/10/19 1530

## 2019-02-10 NOTE — H&P (Addendum)
[facility] Surgery Consult/Admission Note  Gloria Whitney [birth year]  [id number].    Requesting MD: Dr. Vanita Panda Chief Complaint/Reason for Consult: acute cholecysitits  HPI:   Pt is a 78 yo female with a hx of HTN and fibroids who presented to her PCP via telemedicine for abdominal pain and was advised to come to the ED. Pt states similar symptoms in March that resolved with Tagemet. RUQ pain returned 3 days ago. It improved but got severe again. It waxed and waned, non radiating. Associated chills. Pt cannot say of this pain is associated with food. No nausea or vomiting. No diarrhea or changes in bowel movements. She denies urinary symptoms. No CP or SOB or fevers. Pt states a hx of tubal ligation and a surgery "to help her get pregnant". She states she has requried blood transfusions with her GYN surgery and knee replacement surgery. She denies taking blood thinners.    Ct scan showed findings consistent with acute cholecystitis. Multiple noncalcified stones were seen. AST 63, Tbili 1.3.  I spoke to the pt about the incidental finding on CT of the uterine mass. She states she has a hx of Fibroids and she saw her GYN (I believe she said Dr. Landry Mellow) in 01/20. She also states she gets yearly US's. I informed her to follow up with her GYN to discuss CT findings. She denies vaginal bleeding. She expressed understanding.   ROS:  Review of Systems  Constitutional: Negative for chills, diaphoresis and fever.  HENT: Negative for sore throat.   Respiratory: Negative for cough and shortness of breath.   Cardiovascular: Negative for chest pain.  Gastrointestinal: Positive for abdominal pain. Negative for blood in stool, constipation, diarrhea, nausea and vomiting.  Genitourinary: Negative for dysuria.  Skin: Negative for rash.  Neurological: Negative for dizziness and loss of consciousness.  All other systems reviewed and are negative.    History reviewed. No pertinent family  history.  Past Medical History:  Diagnosis Date  . Hypertension     History reviewed. No pertinent surgical history.  Social History:  reports that she has never smoked. She has never used smokeless tobacco. She reports that she does not drink alcohol. No history on file for drug.  Allergies:  Allergies  Allergen Reactions  . Iodine Other (See Comments)    Unable to walk or talk "for a while"     (Not in a hospital admission)   Blood pressure 118/73, pulse (!) 101, temperature (!) 100.8 F (38.2 C), temperature source Oral, resp. rate 18, height 5\' 2"  (1.575 m), weight 78 kg, SpO2 95 %.  Physical Exam Vitals signs and nursing note reviewed.  Constitutional:      General: She is not in acute distress.    Appearance: Normal appearance. She is well-developed. She is not diaphoretic.  HENT:     Head: Normocephalic and atraumatic.     Nose: Nose normal.     Mouth/Throat:     Lips: Pink.     Mouth: Mucous membranes are moist.     Pharynx: Oropharynx is clear.  Eyes:     General: No scleral icterus.       Right eye: No discharge.        Left eye: No discharge.     Conjunctiva/sclera: Conjunctivae normal.     Pupils: Pupils are equal, round, and reactive to light.  Neck:     Musculoskeletal: Normal range of motion and neck supple.  Cardiovascular:     Rate and Rhythm:  Normal rate and regular rhythm.     Pulses:          Radial pulses are 2+ on the right side and 2+ on the left side.       Dorsalis pedis pulses are 2+ on the right side and 2+ on the left side.     Heart sounds: Normal heart sounds. No murmur.  Pulmonary:     Effort: Pulmonary effort is normal. No respiratory distress.     Breath sounds: Normal breath sounds. No decreased breath sounds, wheezing, rhonchi or rales.  Abdominal:     General: Bowel sounds are normal. There is no distension.     Palpations: Abdomen is soft. Abdomen is not rigid.     Tenderness: There is abdominal tenderness in the right  upper quadrant. There is guarding. Positive signs include Murphy's sign.  Musculoskeletal: Normal range of motion.        General: No tenderness or deformity.  Skin:    General: Skin is warm and dry.     Findings: No rash.  Neurological:     Mental Status: She is alert and oriented to person, place, and time.  Psychiatric:        Mood and Affect: Mood normal.        Behavior: Behavior normal.     Results for orders placed or performed during the hospital encounter of 02/10/19 (from the past 48 hour(s))  Comprehensive metabolic panel     Status: Abnormal   Collection Time: 02/10/19 10:14 AM  Result Value Ref Range   Sodium 127 (L) 135 - 145 mmol/L   Potassium 3.5 3.5 - 5.1 mmol/L   Chloride 92 (L) 98 - 111 mmol/L   CO2 23 22 - 32 mmol/L   Glucose, Bld 122 (H) 70 - 99 mg/dL   BUN 14 8 - 23 mg/dL   Creatinine, Ser 1.35 (H) 0.44 - 1.00 mg/dL   Calcium 9.6 8.9 - 10.3 mg/dL   Total Protein 7.5 6.5 - 8.1 g/dL   Albumin 3.5 3.5 - 5.0 g/dL   AST 63 (H) 15 - 41 U/L   ALT 60 (H) 0 - 44 U/L   Alkaline Phosphatase 77 38 - 126 U/L   Total Bilirubin 1.3 (H) 0.3 - 1.2 mg/dL   GFR calc non Af Amer 38 (L) >60 mL/min   GFR calc Af Amer 44 (L) >60 mL/min   Anion gap 12 5 - 15    Comment: Performed at [facility], [address], [place], [place] [address]  Lipase, blood     Status: None   Collection Time: 02/10/19 10:14 AM  Result Value Ref Range   Lipase 34 11 - 51 U/L    Comment: Performed at [facility] [address] [place], [place] [address]  CBC WITH DIFFERENTIAL     Status: Abnormal   Collection Time: 02/10/19 10:14 AM  Result Value Ref Range   WBC 14.6 (H) 4.0 - 10.5 K/uL   RBC 3.82 (L) 3.87 - 5.11 MIL/uL   Hemoglobin 12.1 12.0 - 15.0 g/dL   HCT 35.9 (L) 36.0 - 46.0 %   MCV 94.0 80.0 - 100.0 fL   MCH 31.7 26.0 - 34.0 pg   MCHC 33.7 30.0 - 36.0 g/dL   RDW 12.1 11.5 - 15.5 %   Platelets 189 150 - 400 K/uL   nRBC 0.0 0.0 - 0.2 %   Neutrophils Relative % 81 %    Neutro Abs  11.9 (H) 1.7 - 7.7 K/uL   Lymphocytes Relative 7 %   Lymphs Abs 1.0 0.7 - 4.0 K/uL   Monocytes Relative 11 %   Monocytes Absolute 1.6 (H) 0.1 - 1.0 K/uL   Eosinophils Relative 0 %   Eosinophils Absolute 0.0 0.0 - 0.5 K/uL   Basophils Relative 0 %   Basophils Absolute 0.0 0.0 - 0.1 K/uL   Immature Granulocytes 1 %   Abs Immature Granulocytes 0.10 (H) 0.00 - 0.07 K/uL    Comment: Performed at [facility] [address], [place], [place] [address]   Ct Abdomen Pelvis W Contrast  Result Date: 02/10/2019 CLINICAL DATA:  Abdominal pain. EXAM: CT ABDOMEN AND PELVIS WITH CONTRAST TECHNIQUE: Multidetector CT imaging of the abdomen and pelvis was performed using the standard protocol following bolus administration of intravenous contrast. CONTRAST:  36mL ISOVUE-300 IOPAMIDOL (ISOVUE-300) INJECTION 61% COMPARISON:  None. FINDINGS: Lower chest: There is slight linear atelectasis or scarring at both lung bases. Heart size is normal. Aortic atherosclerosis. Hepatobiliary: The gallbladder is markedly distended. Gallbladder wall is thickened with pericholecystic soft tissue stranding and edema with a small amount of fluid in the right pericolic gutter inferior to the inflamed gallbladder. There multiple noncalcified stones in the gallbladder. No dilated bile ducts. Liver parenchyma is normal. Pancreas: Unremarkable. No pancreatic ductal dilatation or surrounding inflammatory changes. Spleen: Normal in size without focal abnormality. Adrenals/Urinary Tract: Normal appearing adrenal glands. 4 mm stone in the upper pole of the otherwise normal left kidney. Normal right kidney. The bladder appears normal except for compression by the enlarged uterus. No hydronephrosis. Stomach/Bowel: Extensive diverticulosis of the colon, primarily involving the descending portion of the colon. The cecum lies in the midline. The appendix is not identified. Stomach appears normal. Vascular/Lymphatic: Aortic  atherosclerosis. No enlarged abdominal or pelvic lymph nodes. Reproductive: The uterus is markedly enlarged with marked distention of the endometrial cavity with inhomogeneous density within the distended cavity. The cervix is enlarged with inhomogeneous enhancement as well as some dystrophic calcifications in the region of the cervix. The uterus measures 14 x 12 x 12 cm. No discrete right adnexal mass. Dense benign calcifications in the left adnexa may be associated with an atrophic left ovary. Other: There is a small amount of fluid in the right pericolic gutter with a tiny amount of fluid in the cul-de-sac. Tiny periumbilical hernia containing only fat. Musculoskeletal: No acute abnormality. 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 IMPRESSION: 1. Findings consistent acute cholecystitis. Prominent soft tissue stranding and a small amount of fluid around the distended gallbladder. 2. Marked distention of the endometrial cavity of the uterus. 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 Gyn referral with possible tissue sampling is recommended. 3. Extensive colonic diverticulosis. 4.  Aortic Atherosclerosis (ICD10-I70.0). Electronically Signed   By: Lorriane Shire M.D.   On: 02/10/2019 12:44      Assessment/Plan Active Problems:   Acute cholecystitis  Acute cholecystitis - plan for OR tomorrow for lap chole - am CMP, CBC  AKI - IVF, last creatinine was 0.79 on 07/2017, today it is 1.35  Hyponatremia - IVF, salt tabs, am CMP  FEN: CLD then NPO at midnight VTE: SCD's, lovenox ID: WBC 14.6 with left shift, Rocephin 04/16>> Foley: none Follow up: TBD  Plan: admit to [facility], lap chole tomorrow   Kalman Drape, [facility] 02/10/2019, 3:31 PM Pager: [phone] Consults: [phone] Mon-Fri 7:00 am-4:30 pm Sat-Sun  7:00 am-11:30 am

## 2019-02-10 NOTE — ED Notes (Signed)
ED TO INPATIENT HANDOFF REPORT  ED Nurse Name and Phone #: Sherrilynn Gudgel  S Name/Age/Gender Gloria Whitney 78 y.o. female Room/Bed: 019C/019C  Code Status   Code Status: Not on file  Home/SNF/Other Home Patient oriented to: self, place, time and situation Is this baseline? Yes   Triage Complete: Triage complete  Chief Complaint Abd Pain  Triage Note Patient comes from her doctors office c/o abd r sided pain x 3 days. Sent here to check gallbladder. Pt temp was 100 @ PCP. Denies nausea, vomiting. Reports loss of appetite. Also having gen weakness.   Allergies Allergies  Allergen Reactions  . Iodine Other (See Comments)    Unable to walk or talk "for a while"     Level of Care/Admitting Diagnosis ED Disposition    ED Disposition Condition [place]: [place] [[id number]]  Level of Care: Med-Surg [16]  Covid Evaluation: N/A  Diagnosis: Acute cholecystitis [575.0.ICD-9-CM]  Admitting Physician: CCS, [place]  Attending Physician: CCS, MD [[id number]]  Estimated length of stay: past midnight tomorrow  Certification:: I certify this patient will need inpatient services for at least 2 midnights  Bed request comments: 6N  PT Class (Do Not Modify): Inpatient [101]  PT Acc Code (Do Not Modify): Private [1]       B Medical/Surgery History Past Medical History:  Diagnosis Date  . Hypertension    History reviewed. No pertinent surgical history.   A IV Location/Drains/Wounds Patient Lines/Drains/Airways Status   Active Line/Drains/Airways    Name:   Placement date:   Placement time:   Site:   Days:   Peripheral IV 02/10/19 Right Antecubital   02/10/19    1011    Antecubital   less than 1          Intake/Output Last 24 hours No intake or output data in the 24 hours ending 02/10/19 1740  Labs/Imaging Results for orders placed or performed during the hospital encounter of 02/10/19 (from the past 48 hour(s))  Comprehensive  metabolic panel     Status: Abnormal   Collection Time: 02/10/19 10:14 AM  Result Value Ref Range   Sodium 127 (L) 135 - 145 mmol/L   Potassium 3.5 3.5 - 5.1 mmol/L   Chloride 92 (L) 98 - 111 mmol/L   CO2 23 22 - 32 mmol/L   Glucose, Bld 122 (H) 70 - 99 mg/dL   BUN 14 8 - 23 mg/dL   Creatinine, Ser 1.35 (H) 0.44 - 1.00 mg/dL   Calcium 9.6 8.9 - 10.3 mg/dL   Total Protein 7.5 6.5 - 8.1 g/dL   Albumin 3.5 3.5 - 5.0 g/dL   AST 63 (H) 15 - 41 U/L   ALT 60 (H) 0 - 44 U/L   Alkaline Phosphatase 77 38 - 126 U/L   Total Bilirubin 1.3 (H) 0.3 - 1.2 mg/dL   GFR calc non Af Amer 38 (L) >60 mL/min   GFR calc Af Amer 44 (L) >60 mL/min   Anion gap 12 5 - 15    Comment: Performed at [facility], [address] [place], [place] [address]  Lipase, blood     Status: None   Collection Time: 02/10/19 10:14 AM  Result Value Ref Range   Lipase 34 11 - 51 U/L    Comment: Performed at [facility] [address] [place], [place] [address]  CBC WITH DIFFERENTIAL     Status: Abnormal   Collection Time:  02/10/19 10:14 AM  Result Value Ref Range   WBC 14.6 (H) 4.0 - 10.5 K/uL   RBC 3.82 (L) 3.87 - 5.11 MIL/uL   Hemoglobin 12.1 12.0 - 15.0 g/dL   HCT 35.9 (L) 36.0 - 46.0 %   MCV 94.0 80.0 - 100.0 fL   MCH 31.7 26.0 - 34.0 pg   MCHC 33.7 30.0 - 36.0 g/dL   RDW 12.1 11.5 - 15.5 %   Platelets 189 150 - 400 K/uL   nRBC 0.0 0.0 - 0.2 %   Neutrophils Relative % 81 %   Neutro Abs 11.9 (H) 1.7 - 7.7 K/uL   Lymphocytes Relative 7 %   Lymphs Abs 1.0 0.7 - 4.0 K/uL   Monocytes Relative 11 %   Monocytes Absolute 1.6 (H) 0.1 - 1.0 K/uL   Eosinophils Relative 0 %   Eosinophils Absolute 0.0 0.0 - 0.5 K/uL   Basophils Relative 0 %   Basophils Absolute 0.0 0.0 - 0.1 K/uL   Immature Granulocytes 1 %   Abs Immature Granulocytes 0.10 (H) 0.00 - 0.07 K/uL    Comment: Performed at [facility], [address] [place], [place] [address]  Urinalysis, Routine w reflex microscopic     Status:  Abnormal   Collection Time: 02/10/19  3:33 PM  Result Value Ref Range   Color, Urine YELLOW YELLOW   APPearance CLEAR CLEAR   Specific Gravity, Urine 1.031 (H) 1.005 - 1.030   pH 6.0 5.0 - 8.0   Glucose, UA NEGATIVE NEGATIVE mg/dL   Hgb urine dipstick SMALL (A) NEGATIVE   Bilirubin Urine NEGATIVE NEGATIVE   Ketones, ur NEGATIVE NEGATIVE mg/dL   Protein, ur NEGATIVE NEGATIVE mg/dL   Nitrite NEGATIVE NEGATIVE   Leukocytes,Ua NEGATIVE NEGATIVE   RBC / HPF 0-5 0 - 5 RBC/hpf   WBC, UA 0-5 0 - 5 WBC/hpf   Bacteria, UA NONE SEEN NONE SEEN   Squamous Epithelial / LPF 0-5 0 - 5   Mucus PRESENT     Comment: Performed at [facility], [place] [address] [place], [place] [address]   Ct Abdomen Pelvis W Contrast  Result Date: 02/10/2019 CLINICAL DATA:  Abdominal pain. EXAM: CT ABDOMEN AND PELVIS WITH CONTRAST TECHNIQUE: Multidetector CT imaging of the abdomen and pelvis was performed using the standard protocol following bolus administration of intravenous contrast. CONTRAST:  76mL ISOVUE-300 IOPAMIDOL (ISOVUE-300) INJECTION 61% COMPARISON:  None. FINDINGS: Lower chest: There is slight linear atelectasis or scarring at both lung bases. Heart size is normal. Aortic atherosclerosis. Hepatobiliary: The gallbladder is markedly distended. Gallbladder wall is thickened with pericholecystic soft tissue stranding and edema with a small amount of fluid in the right pericolic gutter inferior to the inflamed gallbladder. There multiple noncalcified stones in the gallbladder. No dilated bile ducts. Liver parenchyma is normal. Pancreas: Unremarkable. No pancreatic ductal dilatation or surrounding inflammatory changes. Spleen: Normal in size without focal abnormality. Adrenals/Urinary Tract: Normal appearing adrenal glands. 4 mm stone in the upper pole of the otherwise normal left kidney. Normal right kidney. The bladder appears normal except for compression by the enlarged uterus. No hydronephrosis. Stomach/Bowel:  Extensive diverticulosis of the colon, primarily involving the descending portion of the colon. The cecum lies in the midline. The appendix is not identified. Stomach appears normal. Vascular/Lymphatic: Aortic atherosclerosis. No enlarged abdominal or pelvic lymph nodes. Reproductive: The uterus is markedly enlarged with marked distention of the endometrial cavity with inhomogeneous density within the distended cavity. The cervix is enlarged with  inhomogeneous enhancement as well as some dystrophic calcifications in the region of the cervix. The uterus measures 14 x 12 x 12 cm. No discrete right adnexal mass. Dense benign calcifications in the left adnexa may be associated with an atrophic left ovary. Other: There is a small amount of fluid in the right pericolic gutter with a tiny amount of fluid in the cul-de-sac. Tiny periumbilical hernia containing only fat. Musculoskeletal: No acute abnormality. 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 IMPRESSION: 1. Findings consistent acute cholecystitis. Prominent soft tissue stranding and a small amount of fluid around the distended gallbladder. 2. Marked distention of the endometrial cavity of the uterus. 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 Gyn referral with possible tissue sampling is recommended. 3. Extensive colonic diverticulosis. 4.  Aortic Atherosclerosis (ICD10-I70.0). Electronically Signed   By: Lorriane Shire M.D.   On: 02/10/2019 12:44    Pending Labs [employer] (From admission, onward)    Start     Ordered   Signed and Held  Comprehensive metabolic panel  Tomorrow morning,   R     Signed and Held   Signed and Held  CBC  Tomorrow morning,   R     Signed and Held          Vitals/Pain Today's Vitals   02/10/19 1445 02/10/19 1500 02/10/19 1515 02/10/19 1615  BP: 122/68 118/73 114/76   Pulse: 94 (!) 101 (!) 102   Resp:   18   Temp:    100 F (37.8 C)  TempSrc:     Oral  SpO2: 93% 95% 97%   Weight:      Height:      PainSc:        Isolation Precautions No active isolations  Medications Medications  0.9 %  sodium chloride infusion ( Intravenous New Bag/Given 02/10/19 1020)  0.9 % NaCl with KCl 20 mEq/ L  infusion (has no administration in time range)  morphine 2 MG/ML injection 2 mg (has no administration in time range)  ondansetron (ZOFRAN) injection 4 mg (4 mg Intravenous Given 02/10/19 1020)  iopamidol (ISOVUE-300) 61 % injection 75 mL (75 mLs Intravenous Contrast Given 02/10/19 1205)    Mobility walks Low fall risk   Focused Assessments Cardiac Assessment Handoff:    No results found for: CKTOTAL, CKMB, CKMBINDEX, TROPONINI No results found for: DDIMER Does the Patient currently have chest pain? No   , Neuro Assessment Handoff:  Swallow screen pass? Yes          Neuro Assessment:   Neuro Checks:      Last Documented NIHSS Modified Score:   Has TPA been given? No If patient is a Neuro Trauma and patient is going to OR before floor call report to [place] nurse: [phone] or [phone]     R Recommendations: See Admitting Provider Note  Report given to:   Additional Notes:

## 2019-02-10 NOTE — ED Triage Notes (Signed)
Patient comes from her doctors office c/o abd r sided pain x 3 days. Sent here to check gallbladder. Pt temp was 100 @ PCP. Denies nausea, vomiting. Reports loss of appetite. Also having gen weakness.

## 2019-02-11 ENCOUNTER — Inpatient Hospital Stay (HOSPITAL_COMMUNITY): Payer: Medicare Other | Admitting: Anesthesiology

## 2019-02-11 ENCOUNTER — Encounter (HOSPITAL_COMMUNITY): Admission: EM | Disposition: A | Discharge status: Home Health | Payer: Self-pay | Source: Home / Self Care

## 2019-02-11 ENCOUNTER — Inpatient Hospital Stay (HOSPITAL_COMMUNITY): Payer: Medicare Other

## 2019-02-11 ENCOUNTER — Encounter (HOSPITAL_COMMUNITY): Payer: Self-pay

## 2019-02-11 HISTORY — PX: CHOLECYSTECTOMY: SHX55

## 2019-02-11 LAB — CBC
HCT: 29.3 % — ABNORMAL LOW (ref 36.0–46.0)
Hemoglobin: 10 g/dL — ABNORMAL LOW (ref 12.0–15.0)
MCH: 31.6 pg (ref 26.0–34.0)
MCHC: 34.1 g/dL (ref 30.0–36.0)
MCV: 92.7 fL (ref 80.0–100.0)
Platelets: 154 10*3/uL (ref 150–400)
RBC: 3.16 MIL/uL — ABNORMAL LOW (ref 3.87–5.11)
RDW: 12.2 % (ref 11.5–15.5)
WBC: 12.2 10*3/uL — ABNORMAL HIGH (ref 4.0–10.5)
nRBC: 0 % (ref 0.0–0.2)

## 2019-02-11 LAB — COMPREHENSIVE METABOLIC PANEL
ALT: 55 U/L — ABNORMAL HIGH (ref 0–44)
AST: 47 U/L — ABNORMAL HIGH (ref 15–41)
Albumin: 2.9 g/dL — ABNORMAL LOW (ref 3.5–5.0)
Alkaline Phosphatase: 89 U/L (ref 38–126)
Anion gap: 10 (ref 5–15)
BUN: 18 mg/dL (ref 8–23)
CO2: 23 mmol/L (ref 22–32)
Calcium: 8.9 mg/dL (ref 8.9–10.3)
Chloride: 96 mmol/L — ABNORMAL LOW (ref 98–111)
Creatinine, Ser: 1.27 mg/dL — ABNORMAL HIGH (ref 0.44–1.00)
GFR calc Af Amer: 47 mL/min — ABNORMAL LOW (ref >60)
GFR calc non Af Amer: 41 mL/min — ABNORMAL LOW (ref >60)
Glucose, Bld: 102 mg/dL — ABNORMAL HIGH (ref 70–99)
Potassium: 3.4 mmol/L — ABNORMAL LOW (ref 3.5–5.1)
Sodium: 129 mmol/L — ABNORMAL LOW (ref 135–145)
Total Bilirubin: 1.4 mg/dL — ABNORMAL HIGH (ref 0.3–1.2)
Total Protein: 6.4 g/dL — ABNORMAL LOW (ref 6.5–8.1)

## 2019-02-11 LAB — SURGICAL PCR SCREEN
MRSA, PCR: NEGATIVE
Staphylococcus aureus: NEGATIVE

## 2019-02-11 SURGERY — LAPAROSCOPIC CHOLECYSTECTOMY WITH INTRAOPERATIVE CHOLANGIOGRAM
Anesthesia: General | Site: Abdomen

## 2019-02-11 MED ORDER — FENTANYL CITRATE (PF) 100 MCG/2ML IJ SOLN
25.0000 ug | INTRAMUSCULAR | Status: DC | PRN
  Administered 2019-02-11: 25 ug via INTRAVENOUS

## 2019-02-11 MED ORDER — LACTATED RINGERS IV SOLN
INTRAVENOUS | Status: DC
  Administered 2019-02-11 (×2): via INTRAVENOUS

## 2019-02-11 MED ORDER — FENTANYL CITRATE (PF) 250 MCG/5ML IJ SOLN
INTRAMUSCULAR | Status: AC
  Filled 2019-02-11: qty 5

## 2019-02-11 MED ORDER — FENTANYL CITRATE (PF) 100 MCG/2ML IJ SOLN
INTRAMUSCULAR | Status: AC
  Filled 2019-02-11: qty 2

## 2019-02-11 MED ORDER — OXYCODONE HCL 5 MG PO TABS
5.0000 mg | ORAL_TABLET | ORAL | Status: DC | PRN
  Filled 2019-02-11: qty 1

## 2019-02-11 MED ORDER — ROCURONIUM BROMIDE 50 MG/5ML IV SOSY
PREFILLED_SYRINGE | INTRAVENOUS | Status: AC
  Filled 2019-02-11: qty 5

## 2019-02-11 MED ORDER — ONDANSETRON HCL 4 MG/2ML IJ SOLN
INTRAMUSCULAR | Status: AC
  Filled 2019-02-11: qty 2

## 2019-02-11 MED ORDER — ONDANSETRON 4 MG PO TBDP: 4.0000 mg | ORAL_TABLET | Freq: Four times a day (QID) | ORAL | Status: DC | PRN

## 2019-02-11 MED ORDER — OXYCODONE HCL 5 MG PO TABS
ORAL_TABLET | ORAL | Status: AC
  Filled 2019-02-11: qty 1

## 2019-02-11 MED ORDER — ONDANSETRON HCL 4 MG/2ML IJ SOLN: 4.0000 mg | Freq: Four times a day (QID) | INTRAMUSCULAR | Status: DC | PRN

## 2019-02-11 MED ORDER — PROMETHAZINE HCL 25 MG/ML IJ SOLN: 6.2500 mg | INTRAMUSCULAR | Status: DC | PRN

## 2019-02-11 MED ORDER — FENTANYL CITRATE (PF) 250 MCG/5ML IJ SOLN
INTRAMUSCULAR | Status: DC | PRN
  Administered 2019-02-11: 50 ug via INTRAVENOUS
  Administered 2019-02-11: 100 ug via INTRAVENOUS

## 2019-02-11 MED ORDER — MEPERIDINE HCL 50 MG/ML IJ SOLN: 6.2500 mg | INTRAMUSCULAR | Status: DC | PRN

## 2019-02-11 MED ORDER — LIDOCAINE 2% (20 MG/ML) 5 ML SYRINGE
INTRAMUSCULAR | Status: AC
  Filled 2019-02-11: qty 5

## 2019-02-11 MED ORDER — FENTANYL CITRATE (PF) 100 MCG/2ML IJ SOLN: 25.0000 ug | INTRAMUSCULAR | Status: DC | PRN

## 2019-02-11 MED ORDER — BUPIVACAINE HCL (PF) 0.25 % IJ SOLN
INTRAMUSCULAR | Status: AC
  Filled 2019-02-11: qty 10

## 2019-02-11 MED ORDER — ACETAMINOPHEN 160 MG/5ML PO SOLN: 325.0000 mg | ORAL | Status: DC | PRN

## 2019-02-11 MED ORDER — ONDANSETRON HCL 4 MG/2ML IJ SOLN: 4.0000 mg | Freq: Once | INTRAMUSCULAR | Status: DC | PRN

## 2019-02-11 MED ORDER — DEXAMETHASONE SODIUM PHOSPHATE 10 MG/ML IJ SOLN
INTRAMUSCULAR | Status: AC
  Filled 2019-02-11: qty 1

## 2019-02-11 MED ORDER — DEXAMETHASONE SODIUM PHOSPHATE 10 MG/ML IJ SOLN
INTRAMUSCULAR | Status: DC | PRN
  Administered 2019-02-11: 5 mg via INTRAVENOUS

## 2019-02-11 MED ORDER — OXYCODONE HCL 5 MG PO TABS
5.0000 mg | ORAL_TABLET | Freq: Once | ORAL | Status: AC | PRN
  Administered 2019-02-11: 10:00:00 5 mg via ORAL

## 2019-02-11 MED ORDER — SODIUM CHLORIDE 0.9 % IR SOLN
Status: DC | PRN
  Administered 2019-02-11: 1000 mL

## 2019-02-11 MED ORDER — LIDOCAINE 2% (20 MG/ML) 5 ML SYRINGE
INTRAMUSCULAR | Status: DC | PRN
  Administered 2019-02-11: 100 mg via INTRAVENOUS

## 2019-02-11 MED ORDER — PHENYLEPHRINE 40 MCG/ML (10ML) SYRINGE FOR IV PUSH (FOR BLOOD PRESSURE SUPPORT)
PREFILLED_SYRINGE | INTRAVENOUS | Status: DC | PRN
  Administered 2019-02-11: 80 ug via INTRAVENOUS
  Administered 2019-02-11: 40 ug via INTRAVENOUS

## 2019-02-11 MED ORDER — PROPOFOL 10 MG/ML IV BOLUS
INTRAVENOUS | Status: AC
  Filled 2019-02-11: qty 20

## 2019-02-11 MED ORDER — SUCCINYLCHOLINE CHLORIDE 20 MG/ML IJ SOLN
INTRAMUSCULAR | Status: DC | PRN
  Administered 2019-02-11: 120 mg via INTRAVENOUS

## 2019-02-11 MED ORDER — SODIUM CHLORIDE 0.9 % IV SOLN
INTRAVENOUS | Status: DC | PRN
  Administered 2019-02-11: 09:00:00 100 mL

## 2019-02-11 MED ORDER — CELECOXIB 200 MG PO CAPS
200.0000 mg | ORAL_CAPSULE | Freq: Two times a day (BID) | ORAL | Status: DC
  Administered 2019-02-11 – 2019-02-13 (×5): 200 mg via ORAL
  Filled 2019-02-11 (×5): qty 1

## 2019-02-11 MED ORDER — GABAPENTIN 300 MG PO CAPS
300.0000 mg | ORAL_CAPSULE | Freq: Two times a day (BID) | ORAL | Status: DC
  Administered 2019-02-11 – 2019-02-13 (×5): 300 mg via ORAL
  Filled 2019-02-11 (×5): qty 1

## 2019-02-11 MED ORDER — METOCLOPRAMIDE HCL 5 MG/ML IJ SOLN
INTRAMUSCULAR | Status: AC
  Filled 2019-02-11: qty 2

## 2019-02-11 MED ORDER — METOCLOPRAMIDE HCL 5 MG/ML IJ SOLN
INTRAMUSCULAR | Status: DC | PRN
  Administered 2019-02-11: 5 mg via INTRAVENOUS

## 2019-02-11 MED ORDER — ROCURONIUM BROMIDE 10 MG/ML (PF) SYRINGE
PREFILLED_SYRINGE | INTRAVENOUS | Status: DC | PRN
  Administered 2019-02-11: 40 mg via INTRAVENOUS
  Administered 2019-02-11: 10 mg via INTRAVENOUS

## 2019-02-11 MED ORDER — ACETAMINOPHEN 325 MG PO TABS: 325.0000 mg | ORAL_TABLET | ORAL | Status: DC | PRN

## 2019-02-11 MED ORDER — TRAMADOL HCL 50 MG PO TABS: 50.0000 mg | ORAL_TABLET | Freq: Four times a day (QID) | ORAL | Status: DC | PRN

## 2019-02-11 MED ORDER — 0.9 % SODIUM CHLORIDE (POUR BTL) OPTIME
TOPICAL | Status: DC | PRN
  Administered 2019-02-11: 1000 mL

## 2019-02-11 MED ORDER — BUPIVACAINE HCL (PF) 0.5 % IJ SOLN
INTRAMUSCULAR | Status: AC
  Filled 2019-02-11: qty 30

## 2019-02-11 MED ORDER — OXYCODONE HCL 5 MG/5ML PO SOLN: 5.0000 mg | Freq: Once | ORAL | Status: AC | PRN

## 2019-02-11 MED ORDER — PROPOFOL 10 MG/ML IV BOLUS
INTRAVENOUS | Status: DC | PRN
  Administered 2019-02-11: 150 mg via INTRAVENOUS
  Administered 2019-02-11: 20 mg via INTRAVENOUS

## 2019-02-11 MED ORDER — BUPIVACAINE HCL 0.25 % IJ SOLN
INTRAMUSCULAR | Status: DC | PRN
  Administered 2019-02-11: 30 mL

## 2019-02-11 MED ORDER — SODIUM CHLORIDE 0.9 % IV SOLN
INTRAVENOUS | Status: DC
  Administered 2019-02-11 – 2019-02-12 (×2): via INTRAVENOUS

## 2019-02-11 MED ORDER — ONDANSETRON HCL 4 MG/2ML IJ SOLN
INTRAMUSCULAR | Status: DC | PRN
  Administered 2019-02-11: 4 mg via INTRAVENOUS

## 2019-02-11 MED ORDER — SUGAMMADEX SODIUM 200 MG/2ML IV SOLN
INTRAVENOUS | Status: DC | PRN
  Administered 2019-02-11: 180 mg via INTRAVENOUS

## 2019-02-11 SURGICAL SUPPLY — 54 items
ADH SKN CLS APL DERMABOND .7 (GAUZE/BANDAGES/DRESSINGS) ×1
ADH SKN CLS LQ APL DERMABOND (GAUZE/BANDAGES/DRESSINGS) ×1
APPLIER CLIP 5 13 M/L LIGAMAX5 (MISCELLANEOUS) ×3
APR CLP MED LRG 5 ANG JAW (MISCELLANEOUS) ×1
BAG SPEC RTRVL 10 TROC 200 (ENDOMECHANICALS) ×1
CANISTER SUCT 3000ML PPV (MISCELLANEOUS) ×3 IMPLANT
CHLORAPREP W/TINT 26ML (MISCELLANEOUS) ×3 IMPLANT
CLIP APPLIE 5 13 M/L LIGAMAX5 (MISCELLANEOUS) IMPLANT
CLIP VESOLOCK MED LG 6/CT (CLIP) ×3 IMPLANT
CONT SPEC 4OZ CLIKSEAL STRL BL (MISCELLANEOUS) ×2 IMPLANT
COVER MAYO STAND STRL (DRAPES) ×5 IMPLANT
COVER SURGICAL LIGHT HANDLE (MISCELLANEOUS) ×3 IMPLANT
COVER TRANSDUCER ULTRASND (DRAPES) ×3 IMPLANT
COVER WAND RF STERILE (DRAPES) ×3 IMPLANT
DEFOGGER SCOPE WARMER CLEARIFY (MISCELLANEOUS) IMPLANT
DERMABOND ADHESIVE PROPEN (GAUZE/BANDAGES/DRESSINGS) ×2
DERMABOND ADVANCED (GAUZE/BANDAGES/DRESSINGS) ×2
DERMABOND ADVANCED .7 DNX12 (GAUZE/BANDAGES/DRESSINGS) ×1 IMPLANT
DERMABOND ADVANCED .7 DNX6 (GAUZE/BANDAGES/DRESSINGS) IMPLANT
DRAPE C-ARM 42X72 X-RAY (DRAPES) ×3 IMPLANT
ELECT REM PT RETURN 9FT ADLT (ELECTROSURGICAL) ×3
ELECTRODE REM PT RTRN 9FT ADLT (ELECTROSURGICAL) ×1 IMPLANT
GLOVE BIO SURGEON STRL SZ7.5 (GLOVE) ×3 IMPLANT
GLOVE SURG SS PI 7.5 STRL IVOR (GLOVE) ×2 IMPLANT
GOWN STRL REUS W/ TWL LRG LVL3 (GOWN DISPOSABLE) ×2 IMPLANT
GOWN STRL REUS W/ TWL XL LVL3 (GOWN DISPOSABLE) ×1 IMPLANT
GOWN STRL REUS W/TWL LRG LVL3 (GOWN DISPOSABLE) ×6
GOWN STRL REUS W/TWL XL LVL3 (GOWN DISPOSABLE) ×3
GRASPER SUT TROCAR 14GX15 (MISCELLANEOUS) ×3 IMPLANT
IV CATH 14GX2 1/4 (CATHETERS) ×5 IMPLANT
KIT BASIN OR (CUSTOM PROCEDURE TRAY) ×3 IMPLANT
KIT TURNOVER KIT B (KITS) ×3 IMPLANT
NDL INSUFFLATION 14GA 120MM (NEEDLE) ×1 IMPLANT
NEEDLE INSUFFLATION 14GA 120MM (NEEDLE) ×3 IMPLANT
NS IRRIG 1000ML POUR BTL (IV SOLUTION) ×3 IMPLANT
PAD ARMBOARD 7.5X6 YLW CONV (MISCELLANEOUS) ×6 IMPLANT
POUCH LAPAROSCOPIC INSTRUMENT (MISCELLANEOUS) ×3 IMPLANT
POUCH RETRIEVAL ECOSAC 10 (ENDOMECHANICALS) IMPLANT
POUCH RETRIEVAL ECOSAC 10MM (ENDOMECHANICALS) ×2
SCISSORS LAP 5X35 DISP (ENDOMECHANICALS) ×3 IMPLANT
SET CHOLANGIOGRAPHY FRANKLIN (SET/KITS/TRAYS/PACK) ×5 IMPLANT
SET IRRIG TUBING LAPAROSCOPIC (IRRIGATION / IRRIGATOR) ×3 IMPLANT
SET TUBE SMOKE EVAC HIGH FLOW (TUBING) ×3 IMPLANT
SLEEVE ENDOPATH XCEL 5M (ENDOMECHANICALS) ×5 IMPLANT
SPECIMEN JAR SMALL (MISCELLANEOUS) ×3 IMPLANT
STOPCOCK 4 WAY LG BORE MALE ST (IV SETS) ×3 IMPLANT
SUT MNCRL AB 4-0 PS2 18 (SUTURE) ×3 IMPLANT
SUT VICRYL 0 UR6 27IN ABS (SUTURE) ×4 IMPLANT
TOWEL OR 17X24 6PK STRL BLUE (TOWEL DISPOSABLE) ×3 IMPLANT
TOWEL OR 17X26 10 PK STRL BLUE (TOWEL DISPOSABLE) ×3 IMPLANT
TRAY LAPAROSCOPIC MC (CUSTOM PROCEDURE TRAY) ×3 IMPLANT
TROCAR XCEL NON-BLD 11X100MML (ENDOMECHANICALS) ×3 IMPLANT
TROCAR XCEL NON-BLD 5MMX100MML (ENDOMECHANICALS) ×3 IMPLANT
WATER STERILE IRR 1000ML POUR (IV SOLUTION) ×3 IMPLANT

## 2019-02-11 NOTE — Anesthesia Procedure Notes (Signed)
Procedure Name: Intubation Date/Time: 02/11/2019 8:16 AM Performed by: Bryson Corona, CRNA Pre-anesthesia Checklist: Patient identified, Emergency Drugs available, Suction available and Patient being monitored Patient Re-evaluated:Patient Re-evaluated prior to induction Oxygen Delivery Method: Circle System Utilized Preoxygenation: Pre-oxygenation with 100% oxygen Induction Type: Rapid sequence Ventilation: Mask ventilation without difficulty Laryngoscope Size: Mac and 3 Grade View: Grade I Tube type: Oral Tube size: 7.0 mm Number of attempts: 1 Airway Equipment and Method: Stylet and Oral airway Placement Confirmation: ETT inserted through vocal cords under direct vision,  positive ETCO2 and breath sounds checked- equal and bilateral Secured at: 22 cm Tube secured with: Tape Dental Injury: Teeth and Oropharynx as per pre-operative assessment  Comments: Rapid sequence for COVID precautions.

## 2019-02-11 NOTE — Anesthesia Preprocedure Evaluation (Signed)
Anesthesia Evaluation  Patient identified by MRN, date of birth, ID band Patient awake    Reviewed: Allergy & Precautions, NPO status , Patient's Chart, lab work & pertinent test results  Airway Mallampati: II  TM Distance: >3 FB Neck ROM: Full    Dental  (+) Dental Advisory Given   Pulmonary neg pulmonary ROS,    Pulmonary exam normal breath sounds clear to auscultation       Cardiovascular hypertension, negative cardio ROS Normal cardiovascular exam Rhythm:Regular Rate:Normal     Neuro/Psych negative neurological ROS  negative psych ROS   GI/Hepatic negative GI ROS, Neg liver ROS,   Endo/Other  negative endocrine ROS  Renal/GU negative Renal ROS  negative genitourinary   Musculoskeletal negative musculoskeletal ROS (+)   Abdominal   Peds negative pediatric ROS (+)  Hematology negative hematology ROS (+)   Anesthesia Other Findings   Reproductive/Obstetrics negative OB ROS                             Anesthesia Physical Anesthesia Plan  ASA: II  Anesthesia Plan: General   Post-op Pain Management:    Induction: Intravenous and Rapid sequence  PONV Risk Score and Plan: 4 or greater and Ondansetron, Dexamethasone, Treatment may vary due to age or medical condition and Metaclopromide  Airway Management Planned: Oral ETT  Additional Equipment: None  Intra-op Plan:   Post-operative Plan: Extubation in OR  Informed Consent: I have reviewed the patients History and Physical, chart, labs and discussed the procedure including the risks, benefits and alternatives for the proposed anesthesia with the patient or authorized representative who has indicated his/her understanding and acceptance.     Dental advisory given  Plan Discussed with: CRNA  Anesthesia Plan Comments:         Anesthesia Quick Evaluation

## 2019-02-11 NOTE — Anesthesia Postprocedure Evaluation (Signed)
Anesthesia Post Note  Patient: Gloria Whitney  Procedure(s) Performed: LAPAROSCOPIC CHOLECYSTECTOMY WITH  INTRAOPERATIVE CHOLANGIOGRAM (N/A Abdomen)     Patient location during evaluation: PACU Anesthesia Type: General Level of consciousness: sedated and patient cooperative Pain management: pain level controlled Vital Signs Assessment: post-procedure vital signs reviewed and stable Respiratory status: spontaneous breathing Cardiovascular status: stable Anesthetic complications: no    Last Vitals:  Vitals:   02/11/19 1038 02/11/19 1204  BP: 116/69 115/71  Pulse: 81 82  Resp: 18 15  Temp: 37.1 C 36.7 C  SpO2: 100% 99%    Last Pain:  Vitals:   02/11/19 1422  TempSrc:   PainSc: [place]

## 2019-02-11 NOTE — Transfer of Care (Signed)
Immediate Anesthesia Transfer of Care Note  Patient: Gloria Whitney  Procedure(s) Performed: LAPAROSCOPIC CHOLECYSTECTOMY WITH  INTRAOPERATIVE CHOLANGIOGRAM (N/A Abdomen)  Patient Location: PACU  Anesthesia Type:General  Level of Consciousness: drowsy and patient cooperative  Airway & Oxygen Therapy: Patient Spontanous Breathing and Patient connected to face mask oxygen  Post-op Assessment: Report given to RN and Post -op Vital signs reviewed and stable  Post vital signs: Reviewed and stable  Last Vitals:  Vitals Value Taken Time  BP 114/65 02/11/2019  9:52 AM  Temp    Pulse 87 02/11/2019  9:52 AM  Resp 16 02/11/2019  9:52 AM  SpO2 93 % 02/11/2019  9:52 AM  Vitals shown include unvalidated device data.  Last Pain:  Vitals:   02/11/19 0709  TempSrc: Oral  PainSc:          Complications: No apparent anesthesia complications

## 2019-02-11 NOTE — Op Note (Signed)
02/11/2019  9:40 AM  PATIENT:  Gloria Whitney  78 y.o. female  PRE-OPERATIVE DIAGNOSIS:  ACUTE CHOLECYSTITIS  POST-OPERATIVE DIAGNOSIS:  ACUTE CHOLECYSTITIS  PROCEDURE:  Procedure(s): LAPAROSCOPIC CHOLECYSTECTOMY WITH  INTRAOPERATIVE CHOLANGIOGRAM (N/A)  SURGEON:  Surgeon(s) and Role:    Ralene Ok, MD - Primary  ANESTHESIA:   local and general  EBL:  30 mL   BLOOD ADMINISTERED:none  DRAINS: none   LOCAL MEDICATIONS USED:  BUPIVICAINE   SPECIMEN:  Source of Specimen:  gallbladder  DISPOSITION OF SPECIMEN:  PATHOLOGY  COUNTS:  YES  TOURNIQUET:  * No tourniquets in log *  DICTATION: .Dragon Dictation The patient was taken to the operating and placed in the supine position with bilateral SCDs in place. The patient was prepped and draped in the usual sterile fashion. A time out was called and all facts were verified. A pneumoperitoneum was obtained via A Veress needle technique to a pressure of 47mm of mercury.  A 74mm trochar was then placed in the right upper quadrant under visualization, and there were no injuries to any abdominal organs. A 11 mm port was then placed in the umbilical region after infiltrating with local anesthesia under direct visualization. A second and third epigastric port and right lower quadrant port placement under direct visualization, respectively.    The gallbladder was identified and retracted, the peritoneum was then sharply dissected from the gallbladder and this dissection was carried down to Calot's triangle. The gallbladder was identified and stripped away circumferentially and seen going into the gallbladder 360, the critical angle was obtained. A Cook catheter was used to perform an intraoperative cholangiogram. The cholangiogram showed no filling defects and the contrast emptied into the duodenum easily.  The hepatic ducts were seen to be free of any filling defects. 2 clips were placed proximally one distally and the cystic duct  transected. The cystic artery was identified and 2 clips placed proximally and one distally and transected.   We then proceeded to remove the gallbladder off the hepatic fossa with Bovie cautery. A retrieval bag was then placed in the abdomen and gallbladder placed in the bag. The hepatic fossa was then reexamined and hemostasis was achieved with Bovie cautery and was excellent at the end of the case. The subhepatic fossa and perihepatic fossa was then irrigated until the effluent was clear. The 11 mm trocar fascia was reapproximated with the PMI & a #1 Vicryl x3. The pneumoperitoneum was evacuated and all trochars removed under direct visulalization. The skin was then closed with 4-0 Monocryl and the skin dressed with Dermabond. The patient was awaken from general anesthesia and taken to the recovery room in stable condition.    PLAN OF CARE: Admit for overnight observation  PATIENT DISPOSITION:  PACU - hemodynamically stable.   Delay start of Pharmacological VTE agent (>24hrs) due to surgical blood loss or risk of bleeding: not applicable

## 2019-02-11 NOTE — Progress Notes (Signed)
Day of Surgery   Subjective/Chief Complaint: Pt with no changes overnight    Objective: Vital signs in last 24 hours: Temp:  [98 F (36.7 C)-100.8 F (38.2 C)] 98 F (36.7 C) (04/17 0709) Pulse Rate:  [83-102] 83 (04/17 0352) Resp:  [17-20] 17 (04/17 0352) BP: (104-133)/(58-78) 104/64 (04/17 0352) SpO2:  [93 %-100 %] 100 % (04/17 0352) Weight:  [78 kg-81.4 kg] 81.4 kg (04/16 2020) Last BM Date: 02/10/19  Intake/Output from previous day: 04/16 0701 - 04/17 0700 In: 1482.3 [P.O.:240; I.V.:1142.3; IV Piggyback:100] Out: -  Intake/Output this shift: No intake/output data recorded.  Constitutional: No acute distress, conversant, appears states age. Eyes: Anicteric sclerae, moist conjunctiva, no lid lag Lungs: Clear to auscultation bilaterally, normal respiratory effort CV: regular rate and rhythm, no murmurs, no peripheral edema, pedal pulses 2+ GI: Soft, no masses or hepatosplenomegaly, non-tender to palpation Skin: No rashes, palpation reveals normal turgor Psychiatric: appropriate judgment and insight, oriented to person, place, and time   Lab Results:  Recent Labs    02/10/19 1014 02/11/19 0330  WBC 14.6* 12.2*  HGB 12.1 10.0*  HCT 35.9* 29.3*  PLT 189 154   BMET Recent Labs    02/10/19 1014 02/11/19 0330  NA 127* 129*  K 3.5 3.4*  CL 92* 96*  CO2 23 23  GLUCOSE 122* 102*  BUN 14 18  CREATININE 1.35* 1.27*  CALCIUM 9.6 8.9   PT/INR No results for input(s): LABPROT, INR in the last 72 hours. ABG No results for input(s): PHART, HCO3 in the last 72 hours.  Invalid input(s): PCO2, PO2  Studies/Results: Ct Abdomen Pelvis W Contrast  Result Date: 02/10/2019 CLINICAL DATA:  Abdominal pain. EXAM: CT ABDOMEN AND PELVIS WITH CONTRAST TECHNIQUE: Multidetector CT imaging of the abdomen and pelvis was performed using the standard protocol following bolus administration of intravenous contrast. CONTRAST:  49mL ISOVUE-300 IOPAMIDOL (ISOVUE-300) INJECTION 61%  COMPARISON:  None. FINDINGS: Lower chest: There is slight linear atelectasis or scarring at both lung bases. Heart size is normal. Aortic atherosclerosis. Hepatobiliary: The gallbladder is markedly distended. Gallbladder wall is thickened with pericholecystic soft tissue stranding and edema with a small amount of fluid in the right pericolic gutter inferior to the inflamed gallbladder. There multiple noncalcified stones in the gallbladder. No dilated bile ducts. Liver parenchyma is normal. Pancreas: Unremarkable. No pancreatic ductal dilatation or surrounding inflammatory changes. Spleen: Normal in size without focal abnormality. Adrenals/Urinary Tract: Normal appearing adrenal glands. 4 mm stone in the upper pole of the otherwise normal left kidney. Normal right kidney. The bladder appears normal except for compression by the enlarged uterus. No hydronephrosis. Stomach/Bowel: Extensive diverticulosis of the colon, primarily involving the descending portion of the colon. The cecum lies in the midline. The appendix is not identified. Stomach appears normal. Vascular/Lymphatic: Aortic atherosclerosis. No enlarged abdominal or pelvic lymph nodes. Reproductive: The uterus is markedly enlarged with marked distention of the endometrial cavity with inhomogeneous density within the distended cavity. The cervix is enlarged with inhomogeneous enhancement as well as some dystrophic calcifications in the region of the cervix. The uterus measures 14 x 12 x 12 cm. No discrete right adnexal mass. Dense benign calcifications in the left adnexa may be associated with an atrophic left ovary. Other: There is a small amount of fluid in the right pericolic gutter with a tiny amount of fluid in the cul-de-sac. Tiny periumbilical hernia containing only fat. Musculoskeletal: No acute abnormality. 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 IMPRESSION: 1.  Findings consistent acute cholecystitis. Prominent soft  tissue stranding and a small amount of fluid around the distended gallbladder. 2. Marked distention of the endometrial cavity of the uterus. 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 Gyn referral with possible tissue sampling is recommended. 3. Extensive colonic diverticulosis. 4.  Aortic Atherosclerosis (ICD10-I70.0). Electronically Signed   By: Lorriane Shire M.D.   On: 02/10/2019 12:44    Anti-infectives: Anti-infectives (From admission, onward)   Start     Dose/Rate Route Frequency Ordered Stop   02/10/19 1830  [MAR Hold]  cefTRIAXone (ROCEPHIN) 2 g in sodium chloride 0.9 % 100 mL IVPB     (MAR Hold since Fri 02/11/2019 at 0740. Reason: Transfer to a Procedural area.)   2 g 200 mL/hr over 30 Minutes Intravenous Every 24 hours 02/10/19 1820        Assessment/Plan: 37 F with acute cholecystitis -to OR today for lap chole -All risks and benefits were discussed with the patient to generally include: infection, bleeding, possible need for post op ERCP, damage to the bile ducts, and bile leak. Alternatives were offered and described.  All questions were answered and the patient voiced understanding of the procedure and wishes to proceed at this point with a laparoscopic cholecystectomy   LOS: 1 day    Ralene Ok 02/11/2019

## 2019-02-11 NOTE — Plan of Care (Signed)
  Problem: Health Behavior/Discharge Planning: Goal: Ability to manage health-related needs will improve Outcome: Completed/Met   Problem: Clinical Measurements: Goal: Ability to maintain clinical measurements within normal limits will improve Outcome: Completed/Met Goal: Will remain free from infection Outcome: Completed/Met Goal: Diagnostic test results will improve Outcome: Completed/Met Goal: Respiratory complications will improve Outcome: Completed/Met   Problem: Activity: Goal: Risk for activity intolerance will decrease Outcome: Completed/Met   Problem: Nutrition: Goal: Adequate nutrition will be maintained Outcome: Completed/Met   Problem: Coping: Goal: Level of anxiety will decrease Outcome: Completed/Met   Problem: Elimination: Goal: Will not experience complications related to bowel motility Outcome: Completed/Met   Problem: Pain Managment: Goal: General experience of comfort will improve Outcome: Completed/Met   Problem: Safety: Goal: Ability to remain free from injury will improve Outcome: Completed/Met   Problem: Skin Integrity: Goal: Risk for impaired skin integrity will decrease Outcome: Completed/Met   Problem: Clinical Measurements: Goal: Postoperative complications will be avoided or minimized Outcome: Completed/Met   Problem: Skin Integrity: Goal: Demonstration of wound healing without infection will improve Outcome: Completed/Met

## 2019-02-11 NOTE — OR Nursing (Signed)
Reviewed patients allergy to iodine in the Procedure Timeout with Dr. Rosendo Gros prior to Cholangiogram.

## 2019-02-12 ENCOUNTER — Encounter (HOSPITAL_COMMUNITY): Payer: Self-pay | Admitting: General Surgery

## 2019-02-12 LAB — COMPREHENSIVE METABOLIC PANEL
ALT: 67 U/L — ABNORMAL HIGH (ref 0–44)
AST: 64 U/L — ABNORMAL HIGH (ref 15–41)
Albumin: 2.6 g/dL — ABNORMAL LOW (ref 3.5–5.0)
Alkaline Phosphatase: 119 U/L (ref 38–126)
Anion gap: 13 (ref 5–15)
BUN: 17 mg/dL (ref 8–23)
CO2: 23 mmol/L (ref 22–32)
Calcium: 9.1 mg/dL (ref 8.9–10.3)
Chloride: 98 mmol/L (ref 98–111)
Creatinine, Ser: 1.17 mg/dL — ABNORMAL HIGH (ref 0.44–1.00)
GFR calc Af Amer: 52 mL/min — ABNORMAL LOW (ref >60)
GFR calc non Af Amer: 45 mL/min — ABNORMAL LOW (ref >60)
Glucose, Bld: 112 mg/dL — ABNORMAL HIGH (ref 70–99)
Potassium: 3.8 mmol/L (ref 3.5–5.1)
Sodium: 134 mmol/L — ABNORMAL LOW (ref 135–145)
Total Bilirubin: 0.6 mg/dL (ref 0.3–1.2)
Total Protein: 6.2 g/dL — ABNORMAL LOW (ref 6.5–8.1)

## 2019-02-12 MED ORDER — PANTOPRAZOLE SODIUM 40 MG PO TBEC
40.0000 mg | DELAYED_RELEASE_TABLET | Freq: Every day | ORAL | Status: DC
  Administered 2019-02-12 – 2019-02-13 (×2): 40 mg via ORAL
  Filled 2019-02-12 (×2): qty 1

## 2019-02-12 MED ORDER — OXYCODONE HCL 5 MG PO TABS: 5.0000 mg | ORAL_TABLET | Freq: Four times a day (QID) | ORAL | 0 refills | Status: DC | PRN

## 2019-02-12 NOTE — Discharge Instructions (Signed)

## 2019-02-12 NOTE — Progress Notes (Signed)
[facility]/Trauma Progress Note  1 Day Post-Op   Assessment/Plan Acute cholecystitis - S/P lap chole, Dr. Rosendo Gros, 04/17  AKI - IVF, last creatinine was 0.79 on 07/2017, today it is 1.27, improving, labs pending  Hyponatremia - IVF, labs pending  FEN: reg diet VTE: SCD's, lovenox ID: Rocephin 04/16>> Foley: none Follow up: [facility]  Plan: PT pending, possible dc this afternoon. Labs pending   LOS: 2 days    Subjective: CC: abdominal soreness  Has not walked yet. Tolerating diet. No nausea, vomiting, fever or chills overnight. No CP or SOB. She lives with her husband and 77yo granddaughter.   Objective: Vital signs in last 24 hours: Temp:  [96.4 F (35.8 C)-98.7 F (37.1 C)] 96.4 F (35.8 C) (04/18 0804) Pulse Rate:  [60-88] 60 (04/18 0804) Resp:  [12-18] 16 (04/18 0804) BP: (98-118)/(60-81) 103/64 (04/18 0804) SpO2:  [91 %-100 %] 100 % (04/18 0804) Last BM Date: 02/10/19  Intake/Output from previous day: 04/17 0701 - 04/18 0700 In: 2349.2 [P.O.:30; I.V.:2219.2; IV Piggyback:100] Out: 230 [Urine:200; Blood:30] Intake/Output this shift: No intake/output data recorded.  PE: Gen:  Alert, NAD, pleasant, cooperative Card:  RRR, no M/G/R heard Pulm:  CTA, no W/R/R, rate and effort normal Abd: Soft, ND, +BS, incisions with glue intact appear well without drainage or bleeding. Mild TTP without guarding. No peritonitis  Skin: no rashes noted, warm and dry   Anti-infectives: Anti-infectives (From admission, onward)   Start     Dose/Rate Route Frequency Ordered Stop   02/10/19 1830  cefTRIAXone (ROCEPHIN) 2 g in sodium chloride 0.9 % 100 mL IVPB     2 g 200 mL/hr over 30 Minutes Intravenous Every 24 hours 02/10/19 1820        Lab Results:  Recent Labs    02/10/19 1014 02/11/19 0330  WBC 14.6* 12.2*  HGB 12.1 10.0*  HCT 35.9* 29.3*  PLT 189 154   BMET Recent Labs    02/10/19 1014 02/11/19 0330  NA 127* 129*  K 3.5 3.4*  CL 92* 96*   CO2 23 23  GLUCOSE 122* 102*  BUN 14 18  CREATININE 1.35* 1.27*  CALCIUM 9.6 8.9   PT/INR No results for input(s): LABPROT, INR in the last 72 hours. CMP     Component Value Date/Time   NA 129 (L) 02/11/2019 0330   K 3.4 (L) 02/11/2019 0330   CL 96 (L) 02/11/2019 0330   CO2 23 02/11/2019 0330   GLUCOSE 102 (H) 02/11/2019 0330   BUN 18 02/11/2019 0330   CREATININE 1.27 (H) 02/11/2019 0330   CALCIUM 8.9 02/11/2019 0330   PROT 6.4 (L) 02/11/2019 0330   ALBUMIN 2.9 (L) 02/11/2019 0330   AST 47 (H) 02/11/2019 0330   ALT 55 (H) 02/11/2019 0330   ALKPHOS 89 02/11/2019 0330   BILITOT 1.4 (H) 02/11/2019 0330   GFRNONAA 41 (L) 02/11/2019 0330   GFRAA 47 (L) 02/11/2019 0330   Lipase     Component Value Date/Time   LIPASE 34 02/10/2019 1014    Studies/Results: Dg Cholangiogram Operative  Result Date: 02/11/2019 CLINICAL DATA:  78 year old female undergoing laparoscopic cholecystectomy with intraoperative cholangiogram EXAM: INTRAOPERATIVE CHOLANGIOGRAM TECHNIQUE: Cholangiographic images from the C-arm fluoroscopic device were submitted for interpretation post-operatively. Please see the procedural report for the amount of contrast and the fluoroscopy time utilized. COMPARISON:  Prior CT abdomen/pelvis 02/10/2019 FINDINGS: A single cine run is submitted for review. The images demonstrate cannulation of the cystic duct remanent and opacification of the  biliary tree. No evidence of biliary ductal dilatation, stenosis, stricture or choledocholithiasis. Contrast material refluxes into the main pancreatic duct. No focal filling defect or abnormality. Contrast also passes through the ampulla and into the duodenum. IMPRESSION: Negative intraoperative cholangiogram. Electronically Signed   By: Jacqulynn Cadet M.D.   On: 02/11/2019 10:10   Ct Abdomen Pelvis W Contrast  Result Date: 02/10/2019 CLINICAL DATA:  Abdominal pain. EXAM: CT ABDOMEN AND PELVIS WITH CONTRAST TECHNIQUE: Multidetector CT  imaging of the abdomen and pelvis was performed using the standard protocol following bolus administration of intravenous contrast. CONTRAST:  42mL ISOVUE-300 IOPAMIDOL (ISOVUE-300) INJECTION 61% COMPARISON:  None. FINDINGS: Lower chest: There is slight linear atelectasis or scarring at both lung bases. Heart size is normal. Aortic atherosclerosis. Hepatobiliary: The gallbladder is markedly distended. Gallbladder wall is thickened with pericholecystic soft tissue stranding and edema with a small amount of fluid in the right pericolic gutter inferior to the inflamed gallbladder. There multiple noncalcified stones in the gallbladder. No dilated bile ducts. Liver parenchyma is normal. Pancreas: Unremarkable. No pancreatic ductal dilatation or surrounding inflammatory changes. Spleen: Normal in size without focal abnormality. Adrenals/Urinary Tract: Normal appearing adrenal glands. 4 mm stone in the upper pole of the otherwise normal left kidney. Normal right kidney. The bladder appears normal except for compression by the enlarged uterus. No hydronephrosis. Stomach/Bowel: Extensive diverticulosis of the colon, primarily involving the descending portion of the colon. The cecum lies in the midline. The appendix is not identified. Stomach appears normal. Vascular/Lymphatic: Aortic atherosclerosis. No enlarged abdominal or pelvic lymph nodes. Reproductive: The uterus is markedly enlarged with marked distention of the endometrial cavity with inhomogeneous density within the distended cavity. The cervix is enlarged with inhomogeneous enhancement as well as some dystrophic calcifications in the region of the cervix. The uterus measures 14 x 12 x 12 cm. No discrete right adnexal mass. Dense benign calcifications in the left adnexa may be associated with an atrophic left ovary. Other: There is a small amount of fluid in the right pericolic gutter with a tiny amount of fluid in the cul-de-sac. Tiny periumbilical hernia  containing only fat. Musculoskeletal: No acute abnormality. 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 IMPRESSION: 1. Findings consistent acute cholecystitis. Prominent soft tissue stranding and a small amount of fluid around the distended gallbladder. 2. Marked distention of the endometrial cavity of the uterus. 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 Gyn referral with possible tissue sampling is recommended. 3. Extensive colonic diverticulosis. 4.  Aortic Atherosclerosis (ICD10-I70.0). Electronically Signed   By: Lorriane Shire M.D.   On: 02/10/2019 12:44      Kalman Drape , [facility] Surgery 02/12/2019, 8:54 AM  Pager: [phone] Mon-Wed, Friday 7:00am-4:30pm Thurs 7am-11:30am  Consults: [phone]

## 2019-02-12 NOTE — Evaluation (Signed)
Physical Therapy Evaluation Patient Details Name: Gloria Whitney MRN: [id number] DOB: [birth year] Today's Date: 02/12/2019   History of Present Illness  Pt is a 78 yo female with a hx of HTN and fibroids who presented to her PCP via telemedicine for abdominal pain and was advised to come to the ED.  Pt with diagnosis of cholecystitis with cholecystectomy on 4/17.    Clinical Impression  Pt admitted with above diagnosis. Pt currently with functional limitations due to the deficits listed below (see PT Problem List). Pt overall with good and steady gait without device.  Did tell pt that she could get a RW for incr stability with ambulation but pt declines as she feels she will be fine at home.  Pt with good safety awareness.  REcommend [facility] services to progress pt to prior functional level.   Pt will benefit from skilled PT to increase their independence and safety with mobility to allow discharge to the venue listed below.     Follow Up Recommendations Home health PT;Supervision/Assistance - 24 hour(HHOT, HHAide)    Equipment Recommendations  Other (comment)(Pt declines RW at this time)    Recommendations for Other Services       Precautions / Restrictions Precautions Precautions: Fall Restrictions Weight Bearing Restrictions: No      Mobility  Bed Mobility Overal bed mobility: Independent                Transfers Overall transfer level: Needs assistance Equipment used: None Transfers: Sit to/from Stand Sit to Stand: Supervision         General transfer comment: ABle to stand from recliner and toilet.  Pt did use bathroom while up.  wiped herself as well.   Ambulation/Gait Ambulation/Gait assistance: Supervision;Min guard Gait Distance (Feet): 150 Feet Assistive device: None(occasionally reaching for handrails in hallway) Gait Pattern/deviations: Step-through pattern;Decreased stride length   Gait velocity interpretation: <1.31 ft/sec, indicative of household  ambulator General Gait Details: Pt was able to ambulate without physical assist. Pt occasionally holding onto hand rail.  Pt without LOB and did withstand challenges.  Discussed with pt that she could use RW for ultimate safety and incr stability however pt states she prefers to not get one asshe will be fine at home.  Overall good safety awareness.    Stairs            Wheelchair Mobility    Modified Rankin (Stroke Patients Only)       Balance Overall balance assessment: Needs assistance Sitting-balance support: No upper extremity supported;Feet supported Sitting balance-Leahy Scale: Fair     Standing balance support: No upper extremity supported;During functional activity Standing balance-Leahy Scale: Fair Standing balance comment: able to wash hands at sink and maintain balanc.e                              Pertinent Vitals/Pain Pain Assessment: Faces Faces Pain Scale: Hurts even more Pain Location: incision sites Pain Descriptors / Indicators: Discomfort;Grimacing;Guarding Pain Intervention(s): Limited activity within patient's tolerance;Monitored during session;Premedicated before session;Repositioned    Home Living Family/patient expects to be discharged to:: Private residence Living Arrangements: Spouse/significant other Available Help at Discharge: Family;Available 24 hours/day Type of Home: House Home Access: Stairs to enter Entrance Stairs-Rails: Right Entrance Stairs-Number of Steps: 3 Home Layout: Multi-level;Bed/bath upstairs;Laundry or work area in basement(split level) Home Equipment: [place] - single point(leaves cane in care per pt)      Prior Function Level of Independence:  Independent               Hand Dominance        Extremity/Trunk Assessment   Upper Extremity Assessment Upper Extremity Assessment: Defer to OT evaluation    Lower Extremity Assessment Lower Extremity Assessment: Overall WFL for tasks assessed     Cervical / Trunk Assessment Cervical / Trunk Assessment: Normal  Communication   Communication: No difficulties  Cognition Arousal/Alertness: Awake/alert Behavior During Therapy: WFL for tasks assessed/performed Overall Cognitive Status: Within Functional Limits for tasks assessed                                        General Comments      Exercises     Assessment/Plan    PT Assessment Patient needs continued PT services  PT Problem List Decreased activity tolerance;Decreased balance;Decreased mobility;Decreased knowledge of use of DME;Decreased knowledge of precautions;Decreased safety awareness;Pain       PT Treatment Interventions DME instruction;Gait training;Functional mobility training;Therapeutic activities;Therapeutic exercise;Stair training;Balance training;Patient/family education    PT Goals (Current goals can be found in the Care Plan section)  Acute Rehab PT Goals Patient Stated Goal: to go home PT Goal Formulation: With patient Time For Goal Achievement: 02/26/19 Potential to Achieve Goals: Good    Frequency Min 3X/week   Barriers to discharge        Co-evaluation               AM-PAC PT "6 Clicks" Mobility  Outcome Measure Help needed turning from your back to your side while in a flat bed without using bedrails?: None Help needed moving from lying on your back to sitting on the side of a flat bed without using bedrails?: None Help needed moving to and from a bed to a chair (including a wheelchair)?: None Help needed standing up from a chair using your arms (e.g., wheelchair or bedside chair)?: A Little Help needed to walk in hospital room?: A Little Help needed climbing 3-5 steps with a railing? : A Little 6 Click Score: 21    End of Session Equipment Utilized During Treatment: Gait belt Activity Tolerance: Patient limited by fatigue Patient left: in chair;with call bell/phone within reach;with chair alarm set Nurse  Communication: Mobility status PT Visit Diagnosis: Muscle weakness (generalized) (M62.81);Pain Pain - part of body: (incision sites)    Time: 7588-3254 PT Time Calculation (min) (ACUTE ONLY): 17 min   Charges:   PT Evaluation $PT Eval Low Complexity: [place] Pager:  [phone]  Office:  [phone]    Denice Paradise 02/12/2019, 1:44 PM

## 2019-02-13 MED ORDER — POLYETHYLENE GLYCOL 3350 17 G PO PACK
17.0000 g | PACK | Freq: Every day | ORAL | Status: DC
  Administered 2019-02-13: 17 g via ORAL
  Filled 2019-02-13: qty 1

## 2019-02-13 MED ORDER — POLYETHYLENE GLYCOL 3350 17 G PO PACK: 17.0000 g | PACK | Freq: Every day | ORAL | 0 refills | Status: DC

## 2019-02-13 NOTE — Discharge Summary (Signed)
[facility]/Trauma Discharge Summary   Patient ID: Gloria Whitney MRN: [id number] DOB/AGE: [birth year] 78 y.o.  Admit date: 02/10/2019 Discharge date: 02/13/2019  Admitting Diagnosis: Acute cholecystitis AKI Hyponatremia   Discharge Diagnosis Patient Active Problem List   Diagnosis Date Noted  . Acute cholecystitis 02/10/2019    Consultants none  Imaging: Dg Cholangiogram Operative  Result Date: 02/11/2019 CLINICAL DATA:  78 year old female undergoing laparoscopic cholecystectomy with intraoperative cholangiogram EXAM: INTRAOPERATIVE CHOLANGIOGRAM TECHNIQUE: Cholangiographic images from the C-arm fluoroscopic device were submitted for interpretation post-operatively. Please see the procedural report for the amount of contrast and the fluoroscopy time utilized. COMPARISON:  Prior CT abdomen/pelvis 02/10/2019 FINDINGS: A single cine run is submitted for review. The images demonstrate cannulation of the cystic duct remanent and opacification of the biliary tree. No evidence of biliary ductal dilatation, stenosis, stricture or choledocholithiasis. Contrast material refluxes into the main pancreatic duct. No focal filling defect or abnormality. Contrast also passes through the ampulla and into the duodenum. IMPRESSION: Negative intraoperative cholangiogram. Electronically Signed   By: Jacqulynn Cadet M.D.   On: 02/11/2019 10:10    Procedures Dr. Rosendo Gros (02/11/19) - Laparoscopic Cholecystectomy   HPI: Pt is a 78 yo female with a hx of HTN and fibroids who presented to her PCP via telemedicine for abdominal pain and was advised to come to the ED. Pt states similar symptoms in March that resolved with Tagemet. RUQ pain returned 3 days ago. It improved but got severe again. It waxed and waned, non radiating. Associated chills. Pt cannot say of this pain is associated with food. No nausea or vomiting. No diarrhea or changes in bowel movements. She denies urinary symptoms. No  CP or SOB or fevers. Pt states a hx of tubal ligation and a surgery "to help her get pregnant". She states she has requried blood transfusions with her GYN surgery and knee replacement surgery. She denies taking blood thinners.    Ct scan showed findings consistent with acute cholecystitis. Multiple noncalcified stones were seen. AST 63, Tbili 1.3.  I spoke to the pt about the incidental finding on CT of the uterine mass. She states she has a hx of Fibroids and she saw her GYN (I believe she said Dr. Landry Mellow) in 01/20. She also states she gets yearly US's. I informed her to follow up with her GYN to discuss CT findings. She denies vaginal bleeding. She expressed understanding.   Hospital Course:  Workup showed acute cholecystitis, AKI and hyponatremia.  Patient was admitted and underwent procedure listed above.  Tolerated procedure well and was transferred to the floor.  Diet was advanced as tolerated. Creatinine and sodium improved. Pt worked with PT who recommended [place].  On POD#2, the patient was voiding well, tolerating diet, ambulating well, pain well controlled, vital signs stable, incisions c/d/i and felt stable for discharge home.  Patient will follow up as outlined below and knows to call with questions or concerns.     Patient was discharged in good condition.  The [place] Substance controlled database was reviewed prior to prescribing narcotic pain medication to this patient.  Physical Exam: Gen:  Alert, NAD, pleasant, cooperative Card:  RRR, no M/G/R heard Pulm:  CTA, no W/R/R, rate and effort normal Abd: Soft, ND, +BS, incisions with glue intact appear well without drainage or bleeding. No TTP. No peritonitis  Extremities: no swelling or TTP to calves b/l. No BLE edema.  Skin: no rashes noted, warm and dry  Allergies as of 02/13/2019  Reactions   Iodine Other (See Comments)   Unable to walk or talk "for a while"      Medication List    TAKE these medications    amLODipine 5 MG tablet Commonly known as:  NORVASC Take 5 mg by mouth daily.   hydrochlorothiazide 12.5 MG tablet Commonly known as:  HYDRODIURIL Take 12.5 mg by mouth daily.   omeprazole 20 MG tablet Commonly known as:  PRILOSEC OTC Take 20 mg by mouth daily.   oxyCODONE 5 MG immediate release tablet Commonly known as:  Oxy IR/ROXICODONE Take 1 tablet (5 mg total) by mouth every 6 (six) hours as needed for moderate pain.   polyethylene glycol 17 g packet Commonly known as:  MIRALAX / GLYCOLAX Take 17 g by mouth daily.            Durable Medical Equipment  (From admission, onward)         Start     Ordered   02/13/19 0936  For home use only DME Walker rolling  Once    Question:  Patient needs a walker to treat with the following condition  Answer:  S/P laparoscopic cholecystectomy   02/13/19 0935           Follow-up [facility] Call.   Specialty:  General Surgery Why:  we are working on a follow up televisit appointment for you. Please call our office to see when that is. Please take a photo of your incisions and send to [email or web address] with your name and date of birth in the subject.  Contact information: [place], [facility] Follow up.   Specialty:  [place] Why:  for hoe health services Contact information: [address] [place] [address] [phone]        Christophe Louis, MD. Schedule an appointment as soon as possible for a visit in 1 week(s).   Specialty:  Obstetrics and Gynecology Why:  for follow up of uterine mass seen on CT scan.  Contact information: [address] [place] [address] [phone]           Signed: [facility] 02/13/2019, 9:37 AM Pager: [phone] Consults: [phone] Mon-Fri 7:00 am-4:30  pm Sat-Sun 7:00 am-11:30 am

## 2019-02-13 NOTE — Care Management (Signed)
Spoke w patient on the phone, discussed Medicare rating for [facility] providers. She would like to use [facility]. Referral for [facility] PT OT accepted. Placed order for RW, and [employer] will deliver to room prior to DC. She states her family will be able to provie transportation home.

## 2019-02-13 NOTE — Progress Notes (Signed)
  NURSING PROGRESS NOTE  Gloria Whitney [id number] Discharge Data: 02/13/2019 3:14 PM Attending Provider: No att. providers found CWU:[id number], Pcp Not In     Eda Keys to be D/C'd Home per MD order.  Discussed with the patient the After Visit Summary and all questions fully answered. All IV's discontinued with no bleeding noted. All belongings returned to patient for patient to take home.   Last Vital Signs:  Blood pressure (!) 152/91, pulse 87, temperature 97.7 F (36.5 C), temperature source Oral, resp. rate 20, height 5\' 2"  (1.575 m), weight 81.4 kg, SpO2 97 %.  Discharge Medication List Allergies as of 02/13/2019      Reactions   Iodine Other (See Comments)   Unable to walk or talk "for a while"      Medication List    TAKE these medications   amLODipine 5 MG tablet Commonly known as:  NORVASC Take 5 mg by mouth daily.   hydrochlorothiazide 12.5 MG tablet Commonly known as:  HYDRODIURIL Take 12.5 mg by mouth daily.   omeprazole 20 MG tablet Commonly known as:  PRILOSEC OTC Take 20 mg by mouth daily.   oxyCODONE 5 MG immediate release tablet Commonly known as:  Oxy IR/ROXICODONE Take 1 tablet (5 mg total) by mouth every 6 (six) hours as needed for moderate pain.   polyethylene glycol 17 g packet Commonly known as:  MIRALAX / GLYCOLAX Take 17 g by mouth daily.            Durable Medical Equipment  (From admission, onward)         Start     Ordered   02/13/19 0945  For home use only DME Walker rolling  Once    Question:  Patient needs a walker to treat with the following condition  Answer:  Weakness   02/13/19 0945   02/13/19 0936  For home use only DME Walker rolling  Once    Question:  Patient needs a walker to treat with the following condition  Answer:  S/P laparoscopic cholecystectomy   02/13/19 0935

## 2020-09-15 ENCOUNTER — Ambulatory Visit (INDEPENDENT_AMBULATORY_CARE_PROVIDER_SITE_OTHER): Payer: Medicare Other

## 2020-09-15 ENCOUNTER — Encounter (HOSPITAL_COMMUNITY): Payer: Self-pay | Admitting: Emergency Medicine

## 2020-09-15 ENCOUNTER — Ambulatory Visit (HOSPITAL_COMMUNITY)
Admission: EM | Admit: 2020-09-15 | Discharge: 2020-09-15 | Disposition: A | Discharge status: Home / Self Care | Payer: Medicare Other | Attending: Urgent Care | Admitting: Urgent Care

## 2020-09-15 ENCOUNTER — Other Ambulatory Visit: Payer: Self-pay

## 2020-09-15 DIAGNOSIS — M25561 Pain in right knee: Secondary | ICD-10-CM

## 2020-09-15 DIAGNOSIS — S7291XA Unspecified fracture of right femur, initial encounter for closed fracture: Secondary | ICD-10-CM

## 2020-09-15 DIAGNOSIS — W19XXXA Unspecified fall, initial encounter: Secondary | ICD-10-CM

## 2020-09-15 DIAGNOSIS — M25571 Pain in right ankle and joints of right foot: Secondary | ICD-10-CM

## 2020-09-15 MED ORDER — ACETAMINOPHEN 325 MG PO TABS
650.0000 mg | ORAL_TABLET | Freq: Once | ORAL | Status: AC
  Administered 2020-09-15: 650 mg via ORAL

## 2020-09-15 MED ORDER — ACETAMINOPHEN 325 MG PO TABS
ORAL_TABLET | ORAL | Status: AC
  Filled 2020-09-15: qty 2

## 2020-09-15 MED ORDER — HYDROCODONE-ACETAMINOPHEN 5-325 MG PO TABS
1.0000 | ORAL_TABLET | ORAL | 0 refills | Status: DC | PRN
Start: 2020-09-15 — End: 2021-11-16

## 2020-09-15 NOTE — ED Provider Notes (Signed)
[place]   MRN: [id number] DOB: 1941-07-11  Subjective:   Gloria Whitney is a 79 y.o. female presenting for suffering an accidental fall yesterday while she was on her walk.  Patient states that she twisted her right knee and pain to her ankle.  She has had significant difficulty bearing weight on that leg mostly because of her knee.  She has a history of total right knee replacement.  Reports baseline level of swelling there but not appreciably more swollen.  Has used Tylenol for her pain.  No current facility-administered medications for this encounter.  Current Outpatient Medications:    amLODipine (NORVASC) 5 MG tablet, Take 5 mg by mouth daily., Disp: , Rfl:    hydrochlorothiazide (HYDRODIURIL) 12.5 MG tablet, Take 12.5 mg by mouth daily., Disp: , Rfl:    oxyCODONE (OXY IR/ROXICODONE) 5 MG immediate release tablet, Take 1 tablet (5 mg total) by mouth every 6 (six) hours as needed for moderate pain., Disp: 20 tablet, Rfl: 0   omeprazole (PRILOSEC OTC) 20 MG tablet, Take 20 mg by mouth daily., Disp: , Rfl:    polyethylene glycol (MIRALAX / GLYCOLAX) 17 g packet, Take 17 g by mouth daily., Disp: 14 each, Rfl: 0   Allergies  Allergen Reactions   Iodine Other (See Comments)    Unable to walk or talk "for a while"     Past Medical History:  Diagnosis Date   Hypertension      Past Surgical History:  Procedure Laterality Date   CHOLECYSTECTOMY N/A 02/11/2019   Procedure: LAPAROSCOPIC CHOLECYSTECTOMY WITH  INTRAOPERATIVE CHOLANGIOGRAM;  Surgeon: Ralene Ok, MD;  Location: [facility];  Service: General;  Laterality: N/A;   REPLACEMENT TOTAL KNEE Right    x 3   tubal reconstruction      Family History  Family history unknown: Yes    Social History   Tobacco Use   Smoking status: Never Smoker   Smokeless tobacco: Never Used  Substance Use Topics   Alcohol use: Never   Drug use: Not on file    ROS   Objective:   Vitals: BP  (!) 152/83 (BP Location: Left Arm)    Pulse 79    Temp 98 F (36.7 C) (Oral)    SpO2 99%   Physical Exam Constitutional:      General: She is not in acute distress.    Appearance: Normal appearance. She is well-developed. She is not ill-appearing.  HENT:     Head: Normocephalic and atraumatic.     Right Ear: External ear normal.     Left Ear: External ear normal.     Nose: Nose normal.     Mouth/Throat:     Mouth: Mucous membranes are moist.     Pharynx: Oropharynx is clear.  Eyes:     General: No scleral icterus.       Right eye: No discharge.        Left eye: No discharge.     Extraocular Movements: Extraocular movements intact.     Conjunctiva/sclera: Conjunctivae normal.     Pupils: Pupils are equal, round, and reactive to light.  Cardiovascular:     Rate and Rhythm: Normal rate.  Pulmonary:     Effort: Pulmonary effort is normal.  Musculoskeletal:     Right knee: Swelling present. No deformity or crepitus. Decreased range of motion. Tenderness present over the medial joint line, lateral joint line and patellar tendon.     Right lower leg: No  swelling, deformity, lacerations, tenderness or bony tenderness. No edema.     Right ankle: Swelling present. No deformity, ecchymosis or lacerations. Tenderness present over the lateral malleolus and AITF ligament. No medial malleolus, ATF ligament, base of 5th metatarsal or proximal fibula tenderness. Decreased range of motion.  Skin:    General: Skin is warm and dry.  Neurological:     General: No focal deficit present.     Mental Status: She is alert and oriented to person, place, and time.  Psychiatric:        Mood and Affect: Mood normal.        Behavior: Behavior normal.        Thought Content: Thought content normal.        Judgment: Judgment normal.     DG Ankle Complete Right  Result Date: 09/15/2020 CLINICAL DATA:  Right knee pain after injury. EXAM: RIGHT ANKLE - COMPLETE 3+ VIEW COMPARISON:  None. FINDINGS: Swelling  in the lateral malleolus.  No fractures. IMPRESSION: No ankle fracture identified. Lateral malleolar soft tissue swelling. Electronically Signed   By: Dorise Bullion III M.D   On: 09/15/2020 16:45   DG Knee AP/LAT W/[place] Right  Result Date: 09/15/2020 CLINICAL DATA:  Right knee pain after injury. EXAM: RIGHT KNEE 3 VIEWS COMPARISON:  December 30, 2017 FINDINGS: The patient is status post knee replacement. There appears to be a fracture through the medial aspect of the femur just superior to the most medial aspect of the knee replacement. This is new since 2019. The femoral hardware is otherwise in good position. The tibial hardware is stable in appearance since 2019 with no obvious acute fracture. The proximal fibula appears to be intact. No joint effusion. IMPRESSION: Apparent nondisplaced fracture in the distal medial femur just above the medial most aspect of the femoral component of the knee replacement. Electronically Signed   By: Dorise Bullion III M.D   On: 09/15/2020 16:44    Assessment and Plan :   PDMP not reviewed this encounter.  1. Closed fracture of right femur, unspecified fracture morphology, unspecified portion of femur, initial encounter ([place])   2. Acute pain of right knee   3. Acute right ankle pain   4. Fall, initial encounter     Knee immobilizer and non-weight bearing for non-displaced fracture of right femur. Patient is neurovascularly intact. Schedule APAP and use hydrocodone for breakthrough pain. Follow up with her regular orthopedist or on-call Dr. Marlou Sa. Counseled patient on potential for adverse effects with medications prescribed/recommended today, ER and return-to-clinic precautions discussed, patient verbalized understanding.    Gloria Eagles, PA-C 09/15/20 1729

## 2020-09-15 NOTE — Discharge Instructions (Addendum)
Please schedule Tylenol at 500 mg - 650 mg once every 6 hours as needed for aches and pains.  If you still have pain despite taking Tylenol regularly, this is breakthrough pain.  You can use hydrocodone once every 6 hours for this.  Once your pain is better controlled, switch back to just Tylenol.

## 2020-09-15 NOTE — ED Triage Notes (Addendum)
Pt states she was going for a routine walk yesterday and she fell on a pebble or acorn. She states she fell on her left side and felt a twist in her right knee and ankle. Her right knee is swollen, although she states this is normal following her knee replacement but it is more swollen than usual. Pt states she has taking tylenol for pain. Pt is having trouble putting weight on ankle but she states the pain is mostly from her knee.

## 2020-09-17 DIAGNOSIS — S72434A Nondisplaced fracture of medial condyle of right femur, initial encounter for closed fracture: Secondary | ICD-10-CM | POA: Insufficient documentation

## 2020-10-30 DIAGNOSIS — R29898 Other symptoms and signs involving the musculoskeletal system: Secondary | ICD-10-CM | POA: Diagnosis not present

## 2020-10-30 DIAGNOSIS — M25661 Stiffness of right knee, not elsewhere classified: Secondary | ICD-10-CM | POA: Diagnosis not present

## 2020-10-30 DIAGNOSIS — S72434D Nondisplaced fracture of medial condyle of right femur, subsequent encounter for closed fracture with routine healing: Secondary | ICD-10-CM | POA: Diagnosis not present

## 2020-10-30 DIAGNOSIS — Z7409 Other reduced mobility: Secondary | ICD-10-CM | POA: Diagnosis not present

## 2020-11-07 DIAGNOSIS — Z7409 Other reduced mobility: Secondary | ICD-10-CM | POA: Diagnosis not present

## 2020-11-07 DIAGNOSIS — M25661 Stiffness of right knee, not elsewhere classified: Secondary | ICD-10-CM | POA: Diagnosis not present

## 2020-11-07 DIAGNOSIS — R29898 Other symptoms and signs involving the musculoskeletal system: Secondary | ICD-10-CM | POA: Diagnosis not present

## 2020-11-07 DIAGNOSIS — S72434D Nondisplaced fracture of medial condyle of right femur, subsequent encounter for closed fracture with routine healing: Secondary | ICD-10-CM | POA: Diagnosis not present

## 2020-11-09 DIAGNOSIS — M810 Age-related osteoporosis without current pathological fracture: Secondary | ICD-10-CM | POA: Diagnosis not present

## 2020-11-09 DIAGNOSIS — E7800 Pure hypercholesterolemia, unspecified: Secondary | ICD-10-CM | POA: Diagnosis not present

## 2020-11-09 DIAGNOSIS — E782 Mixed hyperlipidemia: Secondary | ICD-10-CM | POA: Diagnosis not present

## 2020-11-09 DIAGNOSIS — D509 Iron deficiency anemia, unspecified: Secondary | ICD-10-CM | POA: Diagnosis not present

## 2020-11-09 DIAGNOSIS — H35033 Hypertensive retinopathy, bilateral: Secondary | ICD-10-CM | POA: Diagnosis not present

## 2020-11-09 DIAGNOSIS — D649 Anemia, unspecified: Secondary | ICD-10-CM | POA: Diagnosis not present

## 2020-11-09 DIAGNOSIS — I10 Essential (primary) hypertension: Secondary | ICD-10-CM | POA: Diagnosis not present

## 2020-11-13 DIAGNOSIS — S72434D Nondisplaced fracture of medial condyle of right femur, subsequent encounter for closed fracture with routine healing: Secondary | ICD-10-CM | POA: Diagnosis not present

## 2020-11-15 DIAGNOSIS — Z7409 Other reduced mobility: Secondary | ICD-10-CM | POA: Diagnosis not present

## 2020-11-15 DIAGNOSIS — R29898 Other symptoms and signs involving the musculoskeletal system: Secondary | ICD-10-CM | POA: Diagnosis not present

## 2020-11-15 DIAGNOSIS — M25661 Stiffness of right knee, not elsewhere classified: Secondary | ICD-10-CM | POA: Diagnosis not present

## 2020-11-15 DIAGNOSIS — S72434D Nondisplaced fracture of medial condyle of right femur, subsequent encounter for closed fracture with routine healing: Secondary | ICD-10-CM | POA: Diagnosis not present

## 2020-11-23 DIAGNOSIS — Z20822 Contact with and (suspected) exposure to covid-19: Secondary | ICD-10-CM | POA: Diagnosis not present

## 2020-11-27 DIAGNOSIS — Z7409 Other reduced mobility: Secondary | ICD-10-CM | POA: Diagnosis not present

## 2020-11-27 DIAGNOSIS — S72434D Nondisplaced fracture of medial condyle of right femur, subsequent encounter for closed fracture with routine healing: Secondary | ICD-10-CM | POA: Diagnosis not present

## 2020-11-27 DIAGNOSIS — R29898 Other symptoms and signs involving the musculoskeletal system: Secondary | ICD-10-CM | POA: Diagnosis not present

## 2020-11-27 DIAGNOSIS — M25661 Stiffness of right knee, not elsewhere classified: Secondary | ICD-10-CM | POA: Diagnosis not present

## 2020-11-30 DIAGNOSIS — H40013 Open angle with borderline findings, low risk, bilateral: Secondary | ICD-10-CM | POA: Diagnosis not present

## 2020-11-30 DIAGNOSIS — H40033 Anatomical narrow angle, bilateral: Secondary | ICD-10-CM | POA: Diagnosis not present

## 2020-12-06 DIAGNOSIS — S72434D Nondisplaced fracture of medial condyle of right femur, subsequent encounter for closed fracture with routine healing: Secondary | ICD-10-CM | POA: Diagnosis not present

## 2020-12-06 DIAGNOSIS — R29898 Other symptoms and signs involving the musculoskeletal system: Secondary | ICD-10-CM | POA: Diagnosis not present

## 2020-12-06 DIAGNOSIS — M25661 Stiffness of right knee, not elsewhere classified: Secondary | ICD-10-CM | POA: Diagnosis not present

## 2020-12-06 DIAGNOSIS — Z7409 Other reduced mobility: Secondary | ICD-10-CM | POA: Diagnosis not present

## 2020-12-11 DIAGNOSIS — Z7409 Other reduced mobility: Secondary | ICD-10-CM | POA: Diagnosis not present

## 2020-12-11 DIAGNOSIS — M25661 Stiffness of right knee, not elsewhere classified: Secondary | ICD-10-CM | POA: Diagnosis not present

## 2020-12-11 DIAGNOSIS — R29898 Other symptoms and signs involving the musculoskeletal system: Secondary | ICD-10-CM | POA: Diagnosis not present

## 2020-12-11 DIAGNOSIS — S72434D Nondisplaced fracture of medial condyle of right femur, subsequent encounter for closed fracture with routine healing: Secondary | ICD-10-CM | POA: Diagnosis not present

## 2020-12-18 DIAGNOSIS — Z7409 Other reduced mobility: Secondary | ICD-10-CM | POA: Diagnosis not present

## 2020-12-18 DIAGNOSIS — M25661 Stiffness of right knee, not elsewhere classified: Secondary | ICD-10-CM | POA: Diagnosis not present

## 2020-12-18 DIAGNOSIS — S72434D Nondisplaced fracture of medial condyle of right femur, subsequent encounter for closed fracture with routine healing: Secondary | ICD-10-CM | POA: Diagnosis not present

## 2020-12-18 DIAGNOSIS — R29898 Other symptoms and signs involving the musculoskeletal system: Secondary | ICD-10-CM | POA: Diagnosis not present

## 2020-12-25 DIAGNOSIS — S72434D Nondisplaced fracture of medial condyle of right femur, subsequent encounter for closed fracture with routine healing: Secondary | ICD-10-CM | POA: Diagnosis not present

## 2020-12-25 DIAGNOSIS — R29898 Other symptoms and signs involving the musculoskeletal system: Secondary | ICD-10-CM | POA: Diagnosis not present

## 2020-12-25 DIAGNOSIS — Z7409 Other reduced mobility: Secondary | ICD-10-CM | POA: Diagnosis not present

## 2020-12-25 DIAGNOSIS — M25661 Stiffness of right knee, not elsewhere classified: Secondary | ICD-10-CM | POA: Diagnosis not present

## 2021-01-01 DIAGNOSIS — Z7409 Other reduced mobility: Secondary | ICD-10-CM | POA: Diagnosis not present

## 2021-01-01 DIAGNOSIS — M25661 Stiffness of right knee, not elsewhere classified: Secondary | ICD-10-CM | POA: Diagnosis not present

## 2021-01-01 DIAGNOSIS — S72434D Nondisplaced fracture of medial condyle of right femur, subsequent encounter for closed fracture with routine healing: Secondary | ICD-10-CM | POA: Diagnosis not present

## 2021-01-01 DIAGNOSIS — R29898 Other symptoms and signs involving the musculoskeletal system: Secondary | ICD-10-CM | POA: Diagnosis not present

## 2021-01-07 DIAGNOSIS — R2232 Localized swelling, mass and lump, left upper limb: Secondary | ICD-10-CM | POA: Diagnosis not present

## 2021-01-07 DIAGNOSIS — R29898 Other symptoms and signs involving the musculoskeletal system: Secondary | ICD-10-CM | POA: Diagnosis not present

## 2021-01-07 DIAGNOSIS — Z7409 Other reduced mobility: Secondary | ICD-10-CM | POA: Diagnosis not present

## 2021-01-07 DIAGNOSIS — S72434D Nondisplaced fracture of medial condyle of right femur, subsequent encounter for closed fracture with routine healing: Secondary | ICD-10-CM | POA: Diagnosis not present

## 2021-01-07 DIAGNOSIS — M1812 Unilateral primary osteoarthritis of first carpometacarpal joint, left hand: Secondary | ICD-10-CM | POA: Diagnosis not present

## 2021-01-07 DIAGNOSIS — M25661 Stiffness of right knee, not elsewhere classified: Secondary | ICD-10-CM | POA: Diagnosis not present

## 2021-01-10 DIAGNOSIS — R2232 Localized swelling, mass and lump, left upper limb: Secondary | ICD-10-CM | POA: Diagnosis not present

## 2021-01-10 DIAGNOSIS — M25832 Other specified joint disorders, left wrist: Secondary | ICD-10-CM | POA: Diagnosis not present

## 2021-01-15 DIAGNOSIS — Z1211 Encounter for screening for malignant neoplasm of colon: Secondary | ICD-10-CM | POA: Diagnosis not present

## 2021-01-15 DIAGNOSIS — Z0000 Encounter for general adult medical examination without abnormal findings: Secondary | ICD-10-CM | POA: Diagnosis not present

## 2021-01-15 DIAGNOSIS — I10 Essential (primary) hypertension: Secondary | ICD-10-CM | POA: Diagnosis not present

## 2021-01-15 DIAGNOSIS — M810 Age-related osteoporosis without current pathological fracture: Secondary | ICD-10-CM | POA: Diagnosis not present

## 2021-01-15 DIAGNOSIS — M25661 Stiffness of right knee, not elsewhere classified: Secondary | ICD-10-CM | POA: Diagnosis not present

## 2021-01-15 DIAGNOSIS — E7800 Pure hypercholesterolemia, unspecified: Secondary | ICD-10-CM | POA: Diagnosis not present

## 2021-01-15 DIAGNOSIS — S72434D Nondisplaced fracture of medial condyle of right femur, subsequent encounter for closed fracture with routine healing: Secondary | ICD-10-CM | POA: Diagnosis not present

## 2021-01-15 DIAGNOSIS — D649 Anemia, unspecified: Secondary | ICD-10-CM | POA: Diagnosis not present

## 2021-01-15 DIAGNOSIS — R29898 Other symptoms and signs involving the musculoskeletal system: Secondary | ICD-10-CM | POA: Diagnosis not present

## 2021-01-15 DIAGNOSIS — Z7409 Other reduced mobility: Secondary | ICD-10-CM | POA: Diagnosis not present

## 2021-03-05 DIAGNOSIS — M810 Age-related osteoporosis without current pathological fracture: Secondary | ICD-10-CM | POA: Diagnosis not present

## 2021-03-05 DIAGNOSIS — I10 Essential (primary) hypertension: Secondary | ICD-10-CM | POA: Diagnosis not present

## 2021-03-05 DIAGNOSIS — H35033 Hypertensive retinopathy, bilateral: Secondary | ICD-10-CM | POA: Diagnosis not present

## 2021-03-05 DIAGNOSIS — E782 Mixed hyperlipidemia: Secondary | ICD-10-CM | POA: Diagnosis not present

## 2021-03-05 DIAGNOSIS — E7800 Pure hypercholesterolemia, unspecified: Secondary | ICD-10-CM | POA: Diagnosis not present

## 2021-03-05 DIAGNOSIS — D649 Anemia, unspecified: Secondary | ICD-10-CM | POA: Diagnosis not present

## 2021-03-05 DIAGNOSIS — D509 Iron deficiency anemia, unspecified: Secondary | ICD-10-CM | POA: Diagnosis not present

## 2021-03-13 DIAGNOSIS — M810 Age-related osteoporosis without current pathological fracture: Secondary | ICD-10-CM | POA: Diagnosis not present

## 2021-03-15 DIAGNOSIS — M67432 Ganglion, left wrist: Secondary | ICD-10-CM | POA: Diagnosis not present

## 2021-04-08 DIAGNOSIS — R5383 Other fatigue: Secondary | ICD-10-CM | POA: Diagnosis not present

## 2021-04-08 DIAGNOSIS — I951 Orthostatic hypotension: Secondary | ICD-10-CM | POA: Diagnosis not present

## 2021-04-08 DIAGNOSIS — I10 Essential (primary) hypertension: Secondary | ICD-10-CM | POA: Diagnosis not present

## 2021-04-08 DIAGNOSIS — R42 Dizziness and giddiness: Secondary | ICD-10-CM | POA: Diagnosis not present

## 2021-04-08 DIAGNOSIS — Z1211 Encounter for screening for malignant neoplasm of colon: Secondary | ICD-10-CM | POA: Diagnosis not present

## 2021-04-16 DIAGNOSIS — I10 Essential (primary) hypertension: Secondary | ICD-10-CM | POA: Diagnosis not present

## 2021-04-16 DIAGNOSIS — R600 Localized edema: Secondary | ICD-10-CM | POA: Diagnosis not present

## 2021-04-16 DIAGNOSIS — R42 Dizziness and giddiness: Secondary | ICD-10-CM | POA: Diagnosis not present

## 2021-04-16 DIAGNOSIS — E8770 Fluid overload, unspecified: Secondary | ICD-10-CM | POA: Diagnosis not present

## 2021-06-06 DIAGNOSIS — H524 Presbyopia: Secondary | ICD-10-CM | POA: Diagnosis not present

## 2021-06-06 DIAGNOSIS — H2513 Age-related nuclear cataract, bilateral: Secondary | ICD-10-CM | POA: Diagnosis not present

## 2021-06-06 DIAGNOSIS — H40013 Open angle with borderline findings, low risk, bilateral: Secondary | ICD-10-CM | POA: Diagnosis not present

## 2021-06-06 DIAGNOSIS — H40033 Anatomical narrow angle, bilateral: Secondary | ICD-10-CM | POA: Diagnosis not present

## 2021-06-06 DIAGNOSIS — H25013 Cortical age-related cataract, bilateral: Secondary | ICD-10-CM | POA: Diagnosis not present

## 2021-06-14 DIAGNOSIS — D509 Iron deficiency anemia, unspecified: Secondary | ICD-10-CM | POA: Diagnosis not present

## 2021-06-14 DIAGNOSIS — I10 Essential (primary) hypertension: Secondary | ICD-10-CM | POA: Diagnosis not present

## 2021-06-14 DIAGNOSIS — E7800 Pure hypercholesterolemia, unspecified: Secondary | ICD-10-CM | POA: Diagnosis not present

## 2021-06-14 DIAGNOSIS — M810 Age-related osteoporosis without current pathological fracture: Secondary | ICD-10-CM | POA: Diagnosis not present

## 2021-06-14 DIAGNOSIS — E782 Mixed hyperlipidemia: Secondary | ICD-10-CM | POA: Diagnosis not present

## 2021-06-14 DIAGNOSIS — D649 Anemia, unspecified: Secondary | ICD-10-CM | POA: Diagnosis not present

## 2021-07-19 DIAGNOSIS — Z1231 Encounter for screening mammogram for malignant neoplasm of breast: Secondary | ICD-10-CM | POA: Diagnosis not present

## 2021-08-22 DIAGNOSIS — M25562 Pain in left knee: Secondary | ICD-10-CM | POA: Diagnosis not present

## 2021-08-22 DIAGNOSIS — M25561 Pain in right knee: Secondary | ICD-10-CM | POA: Diagnosis not present

## 2021-08-22 DIAGNOSIS — G8929 Other chronic pain: Secondary | ICD-10-CM | POA: Diagnosis not present

## 2021-09-10 DIAGNOSIS — E782 Mixed hyperlipidemia: Secondary | ICD-10-CM | POA: Diagnosis not present

## 2021-09-10 DIAGNOSIS — D509 Iron deficiency anemia, unspecified: Secondary | ICD-10-CM | POA: Diagnosis not present

## 2021-09-10 DIAGNOSIS — M810 Age-related osteoporosis without current pathological fracture: Secondary | ICD-10-CM | POA: Diagnosis not present

## 2021-09-10 DIAGNOSIS — I10 Essential (primary) hypertension: Secondary | ICD-10-CM | POA: Diagnosis not present

## 2021-09-10 DIAGNOSIS — H35033 Hypertensive retinopathy, bilateral: Secondary | ICD-10-CM | POA: Diagnosis not present

## 2021-09-10 DIAGNOSIS — E7800 Pure hypercholesterolemia, unspecified: Secondary | ICD-10-CM | POA: Diagnosis not present

## 2021-09-17 DIAGNOSIS — M25561 Pain in right knee: Secondary | ICD-10-CM | POA: Diagnosis not present

## 2021-09-17 DIAGNOSIS — G8929 Other chronic pain: Secondary | ICD-10-CM | POA: Insufficient documentation

## 2021-09-17 DIAGNOSIS — Z8781 Personal history of (healed) traumatic fracture: Secondary | ICD-10-CM | POA: Diagnosis not present

## 2021-09-17 DIAGNOSIS — Z9181 History of falling: Secondary | ICD-10-CM | POA: Diagnosis not present

## 2021-09-17 DIAGNOSIS — M170 Bilateral primary osteoarthritis of knee: Secondary | ICD-10-CM | POA: Diagnosis not present

## 2021-09-17 DIAGNOSIS — Z5181 Encounter for therapeutic drug level monitoring: Secondary | ICD-10-CM | POA: Diagnosis not present

## 2021-09-17 DIAGNOSIS — M810 Age-related osteoporosis without current pathological fracture: Secondary | ICD-10-CM | POA: Diagnosis not present

## 2021-09-17 DIAGNOSIS — M25562 Pain in left knee: Secondary | ICD-10-CM | POA: Diagnosis not present

## 2021-11-16 ENCOUNTER — Encounter (HOSPITAL_COMMUNITY): Payer: Self-pay | Admitting: *Deleted

## 2021-11-16 ENCOUNTER — Ambulatory Visit (HOSPITAL_COMMUNITY)
Admission: EM | Admit: 2021-11-16 | Discharge: 2021-11-16 | Disposition: A | Discharge status: Home / Self Care | Payer: Medicare Other | Attending: Emergency Medicine | Admitting: Emergency Medicine

## 2021-11-16 ENCOUNTER — Ambulatory Visit (INDEPENDENT_AMBULATORY_CARE_PROVIDER_SITE_OTHER): Payer: Medicare Other

## 2021-11-16 ENCOUNTER — Other Ambulatory Visit: Payer: Self-pay

## 2021-11-16 DIAGNOSIS — M542 Cervicalgia: Secondary | ICD-10-CM

## 2021-11-16 DIAGNOSIS — M4802 Spinal stenosis, cervical region: Secondary | ICD-10-CM

## 2021-11-16 DIAGNOSIS — M549 Dorsalgia, unspecified: Secondary | ICD-10-CM

## 2021-11-16 DIAGNOSIS — M5030 Other cervical disc degeneration, unspecified cervical region: Secondary | ICD-10-CM

## 2021-11-16 MED ORDER — AMLODIPINE BESYLATE 5 MG PO TABS: 5.0000 mg | ORAL_TABLET | Freq: Every day | ORAL | 2 refills | Status: DC

## 2021-11-16 MED ORDER — ACETAMINOPHEN 325 MG PO TABS
975.0000 mg | ORAL_TABLET | Freq: Once | ORAL | Status: AC
  Administered 2021-11-16: 975 mg via ORAL

## 2021-11-16 MED ORDER — DICLOFENAC SODIUM 1 % EX GEL: 4.0000 g | Freq: Four times a day (QID) | CUTANEOUS | 0 refills | Status: AC

## 2021-11-16 MED ORDER — ACETAMINOPHEN 500 MG PO TABS: 500.0000 mg | ORAL_TABLET | Freq: Four times a day (QID) | ORAL | 0 refills | Status: AC | PRN

## 2021-11-16 MED ORDER — BACLOFEN 10 MG PO TABS: 10.0000 mg | ORAL_TABLET | Freq: Every day | ORAL | 0 refills | Status: AC

## 2021-11-16 MED ORDER — ACETAMINOPHEN 325 MG PO TABS
ORAL_TABLET | ORAL | Status: AC
  Filled 2021-11-16: qty 3

## 2021-11-16 NOTE — Discharge Instructions (Addendum)
Your x-ray today showed diffuse degenerative disc disease and facet disease.  You also have narrowing of your spinal canal in your neck, this is causing the numbness and tingling sensation in your fingers and hands.    I strongly recommend that you follow-up with an orthopedic specialist for further evaluation which may include either CT scan of your neck or MRI of your neck.  There are good interventional treatments to relieve your pain and to slow the progression at this debilitating disease.    I do not believe that these findings were caused by your incident at [facility] today.  I do believe that the incident did make your numbness and tingling symptoms symptoms and your pain worse.  Please begin Tylenol 1000 mg 3 times daily for pain relief.  As we discussed, it is important to take this medication regularly, it works better when there is a constant, therapeutic amount in your system.  1000 mg 3 times daily is a safe dose and will not affect your gallbladder, your liver or your kidneys.  Please do not take more than 3000 mg of Tylenol in a 24-hour period.  If you are taking other medications that contain Tylenol, please be sure that you do not exceed 3000 mg/day.  I have also provided you with a prescription for baclofen, this is a muscle relaxer that reduces muscle spasms and can ease pain.  Please take this medication 1 hour prior to bedtime.  After reviewing your medications, this medication can be taken safely with your other medications and will cause no interactions.  I have also provided you with a prescription for Voltaren gel which can be applied at 4 times daily for pain relief.  This is safe for you to use because it is not absorbed into the body.

## 2021-11-16 NOTE — ED Provider Notes (Signed)
[place]    CSN: [id number] Arrival date & time: 11/16/21  1717    HISTORY   Chief Complaint  Patient presents with   Back Pain   HPI Gloria Whitney is a 81 y.o. female. Pt reports she was injured while in [employer] 2-1/2 hours ago.  Patient states that one of the employees came out her with a shopping cart, their cart hit her cart, which she was holding onto with both hands, and it pulled her upper body off to the side.  Patient states she now has pain in her neck and upper back.  Patient reports a remote history of neck pain, states she was seen by chiropractor 3 or 4 years ago.  Patient states she did not have neck pain prior to this incident.  The history is provided by the patient.  Past Medical History:  Diagnosis Date   Hypertension    Patient Active Problem List   Diagnosis Date Noted   Acute cholecystitis 02/10/2019   Past Surgical History:  Procedure Laterality Date   CHOLECYSTECTOMY N/A 02/11/2019   Procedure: LAPAROSCOPIC CHOLECYSTECTOMY WITH  INTRAOPERATIVE CHOLANGIOGRAM;  Surgeon: Ralene Ok, MD;  Location: [facility];  Service: General;  Laterality: N/A;   REPLACEMENT TOTAL KNEE Right    x 3   tubal reconstruction     OB History   No obstetric history on file.    Home Medications    Prior to Admission medications   Medication Sig Start Date End Date Taking? Authorizing Provider  amLODipine (NORVASC) 5 MG tablet Take 5 mg by mouth daily.    [provider]  hydrochlorothiazide (HYDRODIURIL) 12.5 MG tablet Take 12.5 mg by mouth daily.    [provider]  HYDROcodone-acetaminophen (NORCO/VICODIN) 5-325 MG tablet Take 1 tablet by mouth every 4 (four) hours as needed for severe pain. 09/15/20   Jaynee Eagles, PA-C  omeprazole (PRILOSEC OTC) 20 MG tablet Take 20 mg by mouth daily.    [provider]  oxyCODONE (OXY IR/ROXICODONE) 5 MG immediate release tablet Take 1 tablet (5 mg total) by mouth every 6 (six) hours as  needed for moderate pain. 02/12/19   Focht, Fraser Din, PA  polyethylene glycol (MIRALAX / GLYCOLAX) 17 g packet Take 17 g by mouth daily. 02/13/19   Focht, Fraser Din, PA    Family History Family History  Family history unknown: Yes   Social History Social History   Tobacco Use   Smoking status: Never   Smokeless tobacco: Never  Substance Use Topics   Alcohol use: Never   Allergies   Iodine  Review of Systems Review of Systems Pertinent findings noted in history of present illness.   Physical Exam Triage Vital Signs ED Triage Vitals  Enc Vitals Group     BP 08/23/21 0827 (!) 147/82     Pulse Rate 08/23/21 0827 72     Resp 08/23/21 0827 18     Temp 08/23/21 0827 98.3 F (36.8 C)     Temp Source 08/23/21 0827 Oral     SpO2 08/23/21 0827 98 %     Weight --      Height --      Head Circumference --      Peak Flow --      Pain Score 08/23/21 0826 5     Pain Loc --      Pain Edu? --      Excl. in [place]? --   No data found.  Updated Vital  Signs BP (!) 183/107    Pulse (!) 2    Temp 98 F (36.7 C)    Resp 18    SpO2 94%   Physical Exam Vitals and nursing note reviewed.  Constitutional:      General: She is not in acute distress.    Appearance: Normal appearance. She is not ill-appearing.  HENT:     Head: Normocephalic and atraumatic.  Eyes:     General: Lids are normal.        Right eye: No discharge.        Left eye: No discharge.     Extraocular Movements: Extraocular movements intact.     Conjunctiva/sclera: Conjunctivae normal.     Right eye: Right conjunctiva is not injected.     Left eye: Left conjunctiva is not injected.  Neck:     Trachea: Trachea and phonation normal.  Cardiovascular:     Rate and Rhythm: Normal rate and regular rhythm.     Pulses: Normal pulses.     Heart sounds: Normal heart sounds. No murmur heard.   No friction rub. No gallop.  Pulmonary:     Effort: Pulmonary effort is normal. No accessory muscle usage, prolonged expiration or  respiratory distress.     Breath sounds: Normal breath sounds. No stridor, decreased air movement or transmitted upper airway sounds. No decreased breath sounds, wheezing, rhonchi or rales.  Chest:     Chest wall: No tenderness.  Musculoskeletal:        General: Tenderness present. Normal range of motion.     Cervical back: Normal range of motion and neck supple. Normal range of motion.  Lymphadenopathy:     Cervical: No cervical adenopathy.  Skin:    General: Skin is warm and dry.     Findings: No erythema or rash.  Neurological:     General: No focal deficit present.     Mental Status: She is alert and oriented to person, place, and time.  Psychiatric:        Mood and Affect: Mood normal.        Behavior: Behavior normal.    Visual Acuity Right Eye Distance:   Left Eye Distance:   Bilateral Distance:    Right Eye Near:   Left Eye Near:    Bilateral Near:     UC Couse / Diagnostics / Procedures:    EKG  Radiology DG Cervical Spine Complete  Result Date: 11/16/2021 CLINICAL DATA:  Injury, neck pain EXAM: CERVICAL SPINE - COMPLETE 4+ VIEW COMPARISON:  None. FINDINGS: Normal alignment. No fracture. Prevertebral soft tissues are normal. Disc space narrowing and spurring diffusely. Diffuse degenerative facet disease. Multilevel bilateral neural foraminal narrowing. IMPRESSION: Diffuse degenerative disc disease and facet disease with multilevel bilateral neural foraminal narrowing. No acute bony abnormality. Electronically Signed   By: Rolm Baptise M.D.   On: 11/16/2021 18:59    Procedures Procedures (including critical care time)  UC Diagnoses / Final Clinical Impressions(s)   I have reviewed the triage vital signs and the nursing notes.  Pertinent labs & imaging results that were available during my care of the patient were reviewed by me and considered in my medical decision making (see chart for details).    Final diagnoses:  Cervicalgia  Back pain, unspecified back  location, unspecified back pain laterality, unspecified chronicity  Foraminal stenosis of cervical region  Degenerative disc disease, cervical   Patient was advised of x-ray findings.  Patient advised to follow-up with primary care,  possible referral to orthopedics.  I have also advised pt to begin regular doses of Tylenol, take baclofen at bedtime and apply diclofenac gel to areas that are sore.   Patient was provided with a note for work.  ED Prescriptions     Medication Sig Dispense Auth. Provider   acetaminophen (TYLENOL) 500 MG tablet Take 1 tablet (500 mg total) by mouth every 6 (six) hours as needed. 30 tablet Lynden Oxford Scales, PA-C   baclofen (LIORESAL) 10 MG tablet Take 1 tablet (10 mg total) by mouth at bedtime for 7 days. 7 tablet Lynden Oxford Scales, PA-C   amLODipine (NORVASC) 5 MG tablet Take 1 tablet (5 mg total) by mouth daily. 30 tablet Lynden Oxford Scales, PA-C   diclofenac Sodium (VOLTAREN) 1 % GEL Apply 4 g topically 4 (four) times daily. 350 g Lynden Oxford Scales, PA-C      PDMP not reviewed this encounter.  Pending results:  Labs Reviewed - No data to display  Medications Ordered in UC: Medications  acetaminophen (TYLENOL) tablet 975 mg (975 mg Oral Given 11/16/21 1908)    Disposition Upon Discharge:  Condition: stable for discharge home Home: take medications as prescribed; routine discharge instructions as discussed; follow up as advised.  Patient presented with an acute illness with associated systemic symptoms and significant discomfort requiring urgent management. In my opinion, this is a condition that a prudent lay person (someone who possesses an average knowledge of health and medicine) may potentially expect to result in complications if not addressed urgently such as respiratory distress, impairment of bodily function or dysfunction of bodily organs.   Routine symptom specific, illness specific and/or disease specific instructions were  discussed with the patient and/or caregiver at length.   As such, the patient has been evaluated and assessed, work-up was performed and treatment was provided in alignment with urgent care protocols and evidence based medicine.  Patient/parent/caregiver has been advised that the patient may require follow up for further testing and treatment if the symptoms continue in spite of treatment, as clinically indicated and appropriate.  If the patient was tested for COVID-19, Influenza and/or RSV, then the patient/parent/guardian was advised to isolate at home pending the results of his/her diagnostic coronavirus test and potentially longer if theyre positive. I have also advised pt that if his/her COVID-19 test returns positive, it's recommended to self-isolate for at least 10 days after symptoms first appeared AND until fever-free for 24 hours without fever reducer AND other symptoms have improved or resolved. Discussed self-isolation recommendations as well as instructions for household member/close contacts as per the [facility] and [place] DHHS, and also gave patient the [facility] packet with this information.  Patient/parent/caregiver has been advised to return to the [facility] or PCP in 3-5 days if no better; to PCP or the Emergency Department if new signs and symptoms develop, or if the current signs or symptoms continue to change or worsen for further workup, evaluation and treatment as clinically indicated and appropriate  The patient will follow up with their current PCP if and as advised. If the patient does not currently have a PCP we will assist them in obtaining one.   The patient may need specialty follow up if the symptoms continue, in spite of conservative treatment and management, for further workup, evaluation, consultation and treatment as clinically indicated and appropriate.   Patient/parent/caregiver verbalized understanding and agreement of plan as discussed.  All questions were addressed during visit.   Please see discharge instructions below for further  details of plan.  Discharge Instructions:   Discharge Instructions      Your x-ray today showed diffuse degenerative disc disease and facet disease.  You also have narrowing of your spinal canal in your neck, this is causing the numbness and tingling sensation in your fingers and hands.    I strongly recommend that you follow-up with an orthopedic specialist for further evaluation which may include either CT scan of your neck or MRI of your neck.  There are good interventional treatments to relieve your pain and to slow the progression at this debilitating disease.    I do not believe that these findings were caused by your incident at [facility] today.  I do believe that the incident did make your numbness and tingling symptoms symptoms and your pain worse.  Please begin Tylenol 1000 mg 3 times daily for pain relief.  As we discussed, it is important to take this medication regularly, it works better when there is a constant, therapeutic amount in your system.  1000 mg 3 times daily is a safe dose and will not affect your gallbladder, your liver or your kidneys.  Please do not take more than 3000 mg of Tylenol in a 24-hour period.  If you are taking other medications that contain Tylenol, please be sure that you do not exceed 3000 mg/day.  I have also provided you with a prescription for baclofen, this is a muscle relaxer that reduces muscle spasms and can ease pain.  Please take this medication 1 hour prior to bedtime.  After reviewing your medications, this medication can be taken safely with your other medications and will cause no interactions.  I have also provided you with a prescription for Voltaren gel which can be applied at 4 times daily for pain relief.  This is safe for you to use because it is not absorbed into the body.      This office note has been dictated using Museum/gallery curator.  Unfortunately, and despite  my best efforts, this method of dictation can sometimes lead to occasional typographical or grammatical errors.  I apologize in advance if this occurs.     Lynden Oxford Scales, PA-C 11/17/21 1022

## 2021-11-16 NOTE — ED Triage Notes (Signed)
Pt reports she was injured while in [place] today. Pt now has neck and back pain.

## 2021-11-22 DIAGNOSIS — M542 Cervicalgia: Secondary | ICD-10-CM | POA: Diagnosis not present

## 2021-11-22 DIAGNOSIS — R499 Unspecified voice and resonance disorder: Secondary | ICD-10-CM | POA: Diagnosis not present

## 2021-11-22 DIAGNOSIS — M545 Low back pain, unspecified: Secondary | ICD-10-CM | POA: Diagnosis not present

## 2021-11-22 DIAGNOSIS — R42 Dizziness and giddiness: Secondary | ICD-10-CM | POA: Diagnosis not present

## 2021-12-07 ENCOUNTER — Emergency Department (HOSPITAL_COMMUNITY): Payer: Medicare Other

## 2021-12-07 ENCOUNTER — Encounter (HOSPITAL_COMMUNITY): Payer: Self-pay | Admitting: Emergency Medicine

## 2021-12-07 ENCOUNTER — Other Ambulatory Visit: Payer: Self-pay

## 2021-12-07 ENCOUNTER — Emergency Department (HOSPITAL_COMMUNITY)
Admission: EM | Admit: 2021-12-07 | Discharge: 2021-12-07 | Disposition: A | Discharge status: Home / Self Care | Payer: Medicare Other | Attending: Emergency Medicine | Admitting: Emergency Medicine

## 2021-12-07 DIAGNOSIS — M25712 Osteophyte, left shoulder: Secondary | ICD-10-CM | POA: Diagnosis not present

## 2021-12-07 DIAGNOSIS — R0789 Other chest pain: Secondary | ICD-10-CM | POA: Diagnosis not present

## 2021-12-07 DIAGNOSIS — R079 Chest pain, unspecified: Secondary | ICD-10-CM

## 2021-12-07 DIAGNOSIS — M25512 Pain in left shoulder: Secondary | ICD-10-CM | POA: Insufficient documentation

## 2021-12-07 DIAGNOSIS — I10 Essential (primary) hypertension: Secondary | ICD-10-CM | POA: Diagnosis not present

## 2021-12-07 DIAGNOSIS — Z79899 Other long term (current) drug therapy: Secondary | ICD-10-CM | POA: Insufficient documentation

## 2021-12-07 DIAGNOSIS — N200 Calculus of kidney: Secondary | ICD-10-CM | POA: Diagnosis not present

## 2021-12-07 DIAGNOSIS — I7121 Aneurysm of the ascending aorta, without rupture: Secondary | ICD-10-CM

## 2021-12-07 DIAGNOSIS — R0602 Shortness of breath: Secondary | ICD-10-CM | POA: Diagnosis not present

## 2021-12-07 DIAGNOSIS — S4992XA Unspecified injury of left shoulder and upper arm, initial encounter: Secondary | ICD-10-CM | POA: Diagnosis not present

## 2021-12-07 LAB — CBC WITH DIFFERENTIAL/PLATELET
Abs Immature Granulocytes: 0.01 10*3/uL (ref 0.00–0.07)
Basophils Absolute: 0 10*3/uL (ref 0.0–0.1)
Basophils Relative: 0 %
Eosinophils Absolute: 0.1 10*3/uL (ref 0.0–0.5)
Eosinophils Relative: 2 %
HCT: 36 % (ref 36.0–46.0)
Hemoglobin: 12.1 g/dL (ref 12.0–15.0)
Immature Granulocytes: 0 %
Lymphocytes Relative: 29 %
Lymphs Abs: 1.4 10*3/uL (ref 0.7–4.0)
MCH: 32.9 pg (ref 26.0–34.0)
MCHC: 33.6 g/dL (ref 30.0–36.0)
MCV: 97.8 fL (ref 80.0–100.0)
Monocytes Absolute: 0.3 10*3/uL (ref 0.1–1.0)
Monocytes Relative: 7 %
Neutro Abs: 3 10*3/uL (ref 1.7–7.7)
Neutrophils Relative %: 62 %
Platelets: 168 10*3/uL (ref 150–400)
RBC: 3.68 MIL/uL — ABNORMAL LOW (ref 3.87–5.11)
RDW: 12 % (ref 11.5–15.5)
WBC: 4.8 10*3/uL (ref 4.0–10.5)
nRBC: 0 % (ref 0.0–0.2)

## 2021-12-07 LAB — BASIC METABOLIC PANEL
Anion gap: 8 (ref 5–15)
BUN: 6 mg/dL — ABNORMAL LOW (ref 8–23)
CO2: 28 mmol/L (ref 22–32)
Calcium: 9.9 mg/dL (ref 8.9–10.3)
Chloride: 105 mmol/L (ref 98–111)
Creatinine, Ser: 0.76 mg/dL (ref 0.44–1.00)
GFR, Estimated: >60 mL/min (ref >60)
Glucose, Bld: 98 mg/dL (ref 70–99)
Potassium: 3.6 mmol/L (ref 3.5–5.1)
Sodium: 141 mmol/L (ref 135–145)

## 2021-12-07 LAB — TROPONIN I (HIGH SENSITIVITY)
Troponin I (High Sensitivity): 3 ng/L (ref <18)
Troponin I (High Sensitivity): 4 ng/L (ref <18)

## 2021-12-07 LAB — D-DIMER, QUANTITATIVE: D-Dimer, Quant: 3.24 ug/mL-FEU — ABNORMAL HIGH (ref 0.00–0.50)

## 2021-12-07 MED ORDER — DOXYCYCLINE HYCLATE 100 MG PO CAPS: 100.0000 mg | ORAL_CAPSULE | Freq: Two times a day (BID) | ORAL | 0 refills | Status: DC

## 2021-12-07 MED ORDER — AMLODIPINE BESYLATE 5 MG PO TABS
5.0000 mg | ORAL_TABLET | Freq: Once | ORAL | Status: AC
  Administered 2021-12-07: 5 mg via ORAL
  Filled 2021-12-07: qty 1

## 2021-12-07 MED ORDER — IOHEXOL 350 MG/ML SOLN
75.0000 mL | Freq: Once | INTRAVENOUS | Status: AC | PRN
  Administered 2021-12-07: 75 mL via INTRAVENOUS

## 2021-12-07 MED ORDER — CYCLOBENZAPRINE HCL 10 MG PO TABS
5.0000 mg | ORAL_TABLET | Freq: Once | ORAL | Status: AC
  Administered 2021-12-07: 5 mg via ORAL
  Filled 2021-12-07: qty 1

## 2021-12-07 NOTE — Discharge Instructions (Signed)
No evidence of blood clots was found in your lungs.  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  Please inform your primary care doctor of this.  If you have sudden severe pain in your chest that radiates to your back or your neck, you should be reevaluated. There is no definite evidence of infection, however, given your symptoms and some opacities noted on your CT of your chest, we will treat with antibiotics. Please recheck with your doctor this week. Return to the emergency department if you are having worsening symptoms such as worsening pain, fever, or shortness of breath.

## 2021-12-07 NOTE — ED Provider Triage Note (Signed)
Emergency Medicine Provider Triage Evaluation Note  Gloria Whitney , a 81 y.o. female  was evaluated in triage.  Pt complains of left shoulder pain that radiates into left side of neck and down into left ribs. Patient notes she woke up this morning with the pain. Previously seen at [facility] on 1/21 after an injury involving a shopping cart causing neck pain. At that time, cervical x-ray negative for any abnormalities. Patient endorses some shortness of breath. She notes shortness of breath is due to pain on left side of ribs with deep inspiration. No history of blood clots.  Review of Systems  Positive: arthralgia Negative: fever  Physical Exam  There were no vitals taken for this visit. Gen:   Awake, no distress   Resp:  Normal effort  MSK:   Moves extremities without difficulty  Other:  TTP throughout left shoulder and left side of neck. LUE neurovascularly intact.   Medical Decision Making  Medically screening exam initiated at 9:31 AM.  Appropriate orders placed.  JERELINE TICER was informed that the remainder of the evaluation will be completed by another provider, this initial triage assessment does not replace that evaluation, and the importance of remaining in the ED until their evaluation is complete.  Suspect MSK etiology; however given location and associated SOB, will obtain chest pain labs to rule out cardiac etiology.  X-rays ordered   Suzy Bouchard, PA-C 12/07/21 [phone]

## 2021-12-07 NOTE — ED Notes (Signed)
Pt d/c home per MD order. Discharge summary reviewed with pt, pt verbalizes understanding. Ambulatory off unit. No s/s of acute distress noted at discharge.  °

## 2021-12-07 NOTE — ED Provider Notes (Signed)
[facility] EMERGENCY DEPARTMENT Provider Note   CSN: [id number] Arrival date & time: 12/07/21  2703     History  Chief Complaint  Patient presents with   Shoulder Pain    Gloria Whitney is a 81 y.o. female.  HPI 81 year old female history of hypertension presents today complaining of left shoulder and chest side pain.  She states she was well when she went to bed last night.  This morning after awakening she began having pain that radiated from the base of her left shoulder at her neck down into the left side of her chest wall.  It is worse with deep breathing.  There is no associated cough or fever.  She had some pain in her neck several weeks ago after she jerked her head when hit with a shopping cart.  She was seen at urgent care at that time.  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  However the back pain has improved.  This pain feels different than that did.  Does not describe absolute dyspnea but does have pain with deep breathing causing her to feel somewhat short of breath.  She has not noted fever, chills, history of DVT, PE, history of coronary artery disease or exertional chest pain.  She denies any history of large vessel disease such as aortic dissection.  She did not take her blood pressure medicine this morning.  She would normally take her amlodipine in the morning.  She was taking a friend to do an errand due to the pain she decided not to continue and came to the ED instead.  She has not taken anything for the pain she has not taken her morning blood pressure medicines     Home Medications Prior to Admission medications   Medication Sig Start Date End Date Taking? Authorizing Provider  doxycycline (VIBRAMYCIN) 100 MG capsule Take 1 capsule (100 mg total) by mouth 2 (two) times daily. 12/07/21  Yes Pattricia Boss, MD  acetaminophen (TYLENOL) 500 MG tablet Take 1 tablet (500 mg total) by mouth every 6 (six) hours as needed. 11/16/21   Lynden Oxford Scales, PA-C  amLODipine (NORVASC) 5 MG tablet Take 1 tablet (5 mg total) by mouth daily. 11/16/21 02/14/22  Lynden Oxford Scales, PA-C  diclofenac Sodium (VOLTAREN) 1 % GEL Apply 4 g topically 4 (four) times daily. 11/16/21   Lynden Oxford Scales, PA-C  hydrochlorothiazide (HYDRODIURIL) 12.5 MG tablet Take 12.5 mg by mouth daily.    [provider]  omeprazole (PRILOSEC OTC) 20 MG tablet Take 20 mg by mouth daily.    [provider]  polyethylene glycol (MIRALAX / GLYCOLAX) 17 g packet Take 17 g by mouth daily. 02/13/19   Kalman Drape, PA      Allergies    Iodine    Review of Systems   Review of Systems  All other systems reviewed and are negative.  Physical Exam Updated Vital Signs BP 139/88    Pulse 82    Temp 98.2 F (36.8 C) (Oral)    Resp 15    SpO2 98%  Physical Exam Vitals and nursing note reviewed.  Constitutional:      General: She is not in acute distress.    Appearance: Normal appearance. She is normal weight. She is not ill-appearing.  HENT:     Head: Normocephalic and atraumatic.     Right Ear: External ear normal.     Left Ear: External ear normal.  Nose: Nose normal.     Mouth/Throat:     Pharynx: Oropharynx is clear.  Eyes:     Extraocular Movements: Extraocular movements intact.     Pupils: Pupils are equal, round, and reactive to light.  Cardiovascular:     Rate and Rhythm: Normal rate and regular rhythm.     Pulses: Normal pulses.  Pulmonary:     Effort: Pulmonary effort is normal.     Breath sounds: Normal breath sounds.  Abdominal:     General: Abdomen is flat. Bowel sounds are normal.     Palpations: Abdomen is soft.  Musculoskeletal:        General: Normal range of motion.     Cervical back: Normal range of motion and neck supple. No rigidity or tenderness.  Skin:    General: Skin is warm and dry.     Capillary Refill: Capillary refill takes less than 2 seconds.  Neurological:     General: No focal deficit  present.     Cranial Nerves: No cranial nerve deficit.     Sensory: No sensory deficit.     Motor: No weakness.     Coordination: Coordination normal.     Deep Tendon Reflexes: Reflexes normal.     Comments: No palmar drift noted Radial pulses are equal bilaterally No sensory deficit   Psychiatric:        Mood and Affect: Mood normal.        Behavior: Behavior normal.    ED Results / Procedures / Treatments   Labs (all labs ordered are listed, but only abnormal results are displayed) Labs Reviewed  CBC WITH DIFFERENTIAL/PLATELET - Abnormal; Notable for the following components:      Result Value   RBC 3.68 (*)    All other components within normal limits  BASIC METABOLIC PANEL - Abnormal; Notable for the following components:   BUN 6 (*)    All other components within normal limits  D-DIMER, QUANTITATIVE - Abnormal; Notable for the following components:   D-Dimer, Quant 3.24 (*)    All other components within normal limits  TROPONIN I (HIGH SENSITIVITY)  TROPONIN I (HIGH SENSITIVITY)    EKG EKG Interpretation  Date/Time:  Saturday December 07 2021 09:35:58 EST Ventricular Rate:  90 PR Interval:  210 QRS Duration: 82 QT Interval:  344 QTC Calculation: 420 R Axis:   5 Text Interpretation: Sinus rhythm with 1st degree A-V block Nonspecific ST and T wave abnormality Abnormal ECG When compared with ECG of 22-Nov-2002 08:49, PREVIOUS ECG IS PRESENT difficult to interpret due to poor data quality. will order repeat Confirmed by Pattricia Boss [phone]) on 12/07/2021 11:52:27 AM  Radiology DG Chest 1 View  Result Date: 12/07/2021 CLINICAL DATA:  Shortness of breath and left arm pain status post injury EXAM: CHEST  1 VIEW COMPARISON:  None. FINDINGS: The heart size and mediastinal contours are within normal limits. Both lungs are clear. The visualized skeletal structures are unremarkable. IMPRESSION: No active disease. Electronically Signed   By: Miachel Roux M.D.   On: 12/07/2021  10:51   CT Angio Chest PE W and/or Wo Contrast  Result Date: 12/07/2021 CLINICAL DATA:  Patient awoke with left-sided shoulder pain and shortness of breath this morning. EXAM: CT ANGIOGRAPHY CHEST WITH CONTRAST TECHNIQUE: Multidetector CT imaging of the chest was performed using the standard protocol during bolus administration of intravenous contrast. Multiplanar CT image reconstructions and MIPs were obtained to evaluate the vascular anatomy. RADIATION DOSE REDUCTION: This exam was  performed according to the departmental dose-optimization program which includes automated exposure control, adjustment of the mA and/or kV according to patient size and/or use of iterative reconstruction technique. CONTRAST:  41m OMNIPAQUE IOHEXOL 350 MG/ML SOLN COMPARISON:  Current chest radiograph. FINDINGS: Cardiovascular: Pulmonary arteries are well opacified. There is no evidence of a pulmonary embolism. Heart is borderline enlarged. No pericardial effusion. Ascending thoracic aorta measures 4 cm in diameter. No aortic dissection. Mild aortic atherosclerosis. Mediastinum/Nodes: The no neck base, mediastinal or hilar masses or enlarged lymph nodes. Trachea and esophagus are unremarkable. Lungs/Pleura: Lung bases show reticular opacities with intervening ground-glass opacities. Mild focus of hazy opacity and reticular opacities are noted in the right upper lobe anteriorly and medially. Mild areas of interstitial thickening, most evident in the lower lungs. No lung consolidation. No evidence of pulmonary edema. No pleural effusion or pneumothorax. Upper Abdomen: No acute findings.  Small left intrarenal stone. Musculoskeletal: No fracture or acute finding.  No bone lesion. Review of the MIP images confirms the above findings. IMPRESSION: 1. No evidence of a pulmonary embolism. 2. No convincing acute findings. Lungs show basilar areas linear and reticular opacities with mild intervening ground-glass opacities suspected to be a  combination of mild fibrosis and atelectasis. 3. Ascending aorta dilated to 4 cm. Recommend annual imaging followup by CTA or MRA. This recommendation follows 2010 ACCF/AHA/AATS/ACR/ASA/SCA/SCAI/SIR/STS/SVM Guidelines for the Diagnosis and Management of Patients with Thoracic Aortic Disease. Circulation. 2010; 121:: M211-Z735 Aortic aneurysm NOS (ICD10-I71.9) Aortic Atherosclerosis (ICD10-I70.0). Electronically Signed   By: DLajean ManesM.D.   On: 12/07/2021 14:12   DG Shoulder Left  Result Date: 12/07/2021 CLINICAL DATA:  Left neck and arm pain status post injury EXAM: LEFT SHOULDER - 2+ VIEW COMPARISON:  None. FINDINGS: Mild spurring and joint space loss of the left acromioclavicular joint. Irregularity of the greater tuberosity consistent with rotator cuff tendinopathy. Mild spurring of the glenohumeral joint. IMPRESSION: No acute fracture or dislocation of the left shoulder. Electronically Signed   By: FMiachel RouxM.D.   On: 12/07/2021 10:50    Procedures Procedures    Medications Ordered in ED Medications  cyclobenzaprine (FLEXERIL) tablet 5 mg (5 mg Oral Given 12/07/21 1144)  amLODipine (NORVASC) tablet 5 mg (5 mg Oral Given 12/07/21 1144)  iohexol (OMNIPAQUE) 350 MG/ML injection 75 mL (75 mLs Intravenous Contrast Given 12/07/21 1331)    ED Course/ Medical Decision Making/ A&P Clinical Course as of 12/07/21 1448  Sat Dec 07, 2021  16701Basic metabolic panel(!) [DR]  14103CBC with Differential(!) CBC personally  reviewed and interpreted with no acute abnormalities noted Be met personally reviewed and interpreted with no acute abnormalities noted [DR]  1154 Chest x-Josecarlos Harriott reviewed and interpreted with no acute abnormalities noted Audiologist interpretation reviewed and concurs [DR]  1155 Left shoulder x-Jerzi Tigert reviewed, interpreted, and radiologist interpretation reviewed without any evidence of acute abnormality [DR]  10131CT angio reviewed with no acute findings noted Radiologist  interpretation notes no evidence of pulmonary embolism, no convincing acute findings, basilar areas of linear reticular opacities with mild intervening groundglass opacities suspect some fibrosis and atelectasis, ascending aorta dilated to 4 cm recommended annual imaging follow-up by CTA or MRA. [DR]    Clinical Course User Index [DR] RPattricia Boss MD                           Medical Decision Making 81year old female with left shoulder pain to the left side of chest.  Atypical pain for coronary artery disease. Left shoulder without evidence of acute abnormality and does not appear to be pain related to the left shoulder joint. 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  Patient does not feel like this pain is like prior that she ahd with this.   She was seen in f/u by her pmd after pain atttibuted to cervical problems flared several weeks ago after pulling accident.  She feels that got better and this is different.  No weakness, numbness noted. Patient seen and evaluated for chest pain.  Differential diagnosis of serious/life threatening causes of chest pain includes ACS, other diseases of the heart such as myocarditis or pericarditis, lung etiologies such as infection or pneumothorax, diseases of the great vessels such as aortic dissection or AAA, pulmonary embolism, or GI sources such as cholecystitis or other upper abdominal causes. Doubt ACS- heart score documented, EKG reviewed, Given the timing of pain to ER presentation, single troponin3, delta troponin4 was so doubt NSTEMI troponin and repeat troponin obtained and WNL Doubt myocarditis/pericarditis/tamponade based on history, review of ekg and labs Doubt aortic dissection based on history and review of imaging-4 cm ascending aortic aneurysm with recommendations for annual reimaging Will discuss with patient and place recommendations and discharge Doubt intrinsic lung causes such as pneumonia or pneumothorax, based on  history, physical exam, and studies obtained-patient does not appear to have acute infectious symptoms however there are some groundglass opacities noted with linear opacifications noted that are more suggestive of chronic disease and atelectasis, however given patient's symptoms, will treat with antibiotics.  Advised Doubt PE based on history, physical exam, and PERC Planned to cta to evaluate, but reported iodine allergy.  Review of chart does not appear true allergy.   Pharmacy investigating and premedication initiated.  Doubt acute GI etiology requiring intervention based on history, physical exam and labs. Patient appears stable for discharge. Return precautions and need for follow up discussed and patient voices understanding   Amount and/or Complexity of Data Reviewed Labs: ordered. Decision-making details documented in ED Course. Radiology: ordered.  Risk Prescription drug management.   Discussed all of the above with patient.  We discussed return precautions and need for follow-up.        Final Clinical Impression(s) / ED Diagnoses Final diagnoses:  Chest pain, unspecified type  Aneurysm of ascending aorta without rupture    Rx / DC Orders ED Discharge Orders          Ordered    doxycycline (VIBRAMYCIN) 100 MG capsule  2 times daily        12/07/21 1448              Pattricia Boss, MD 12/07/21 1448

## 2021-12-07 NOTE — ED Triage Notes (Signed)
Woke up with L sided shoulder pain and SOB this morning.  States a cart fell on her L shoulder on 1/21 while at [facility] but states the pain feels different this morning and radiates to L neck and down L arm.  States the SOB is new.

## 2021-12-07 NOTE — ED Notes (Signed)
Pt transported to CT ?

## 2021-12-09 DIAGNOSIS — R071 Chest pain on breathing: Secondary | ICD-10-CM | POA: Diagnosis not present

## 2021-12-09 DIAGNOSIS — M542 Cervicalgia: Secondary | ICD-10-CM | POA: Diagnosis not present

## 2022-01-22 DIAGNOSIS — R490 Dysphonia: Secondary | ICD-10-CM | POA: Insufficient documentation

## 2022-01-22 DIAGNOSIS — K219 Gastro-esophageal reflux disease without esophagitis: Secondary | ICD-10-CM | POA: Diagnosis not present

## 2022-01-22 DIAGNOSIS — J3489 Other specified disorders of nose and nasal sinuses: Secondary | ICD-10-CM | POA: Diagnosis not present

## 2022-03-19 DIAGNOSIS — M810 Age-related osteoporosis without current pathological fracture: Secondary | ICD-10-CM | POA: Diagnosis not present

## 2022-03-27 DIAGNOSIS — M542 Cervicalgia: Secondary | ICD-10-CM | POA: Diagnosis not present

## 2022-04-21 IMAGING — CT CT ANGIO CHEST
3 of 7 series · 18 of 36 positions shown · IV contrast (agent unspecified)
Comparison: Current chest radiograph.

CLINICAL DATA: Patient awoke with left-sided shoulder pain and
shortness of breath this morning.

EXAM:
CT ANGIOGRAPHY CHEST WITH CONTRAST
TECHNIQUE: Multidetector CT imaging of the chest was performed using the
standard protocol during bolus administration of intravenous
contrast. Multiplanar CT image reconstructions and MIPs were
obtained to evaluate the vascular anatomy.

[Series 6: pe lung · axial · 0.53mm/px · z∈[-679,-621]mm · 2 of 115 slices shown]
[im 29/115  mediastinal]
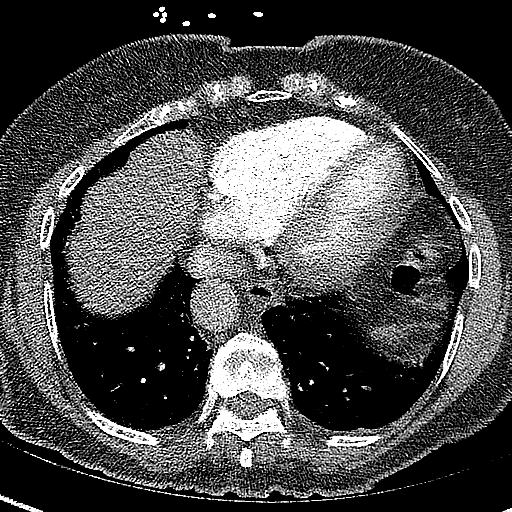
[im 58/115  mediastinal]
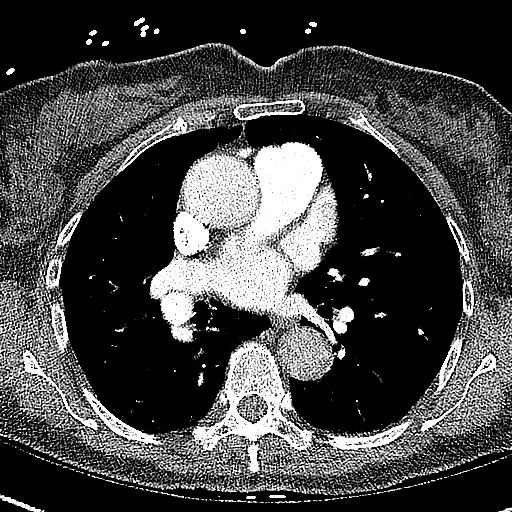

[Series 7: pe thins · axial · 0.58mm/px · z∈[-772,-525]mm · 15 of 405 slices shown]
[im 26/405  lung]
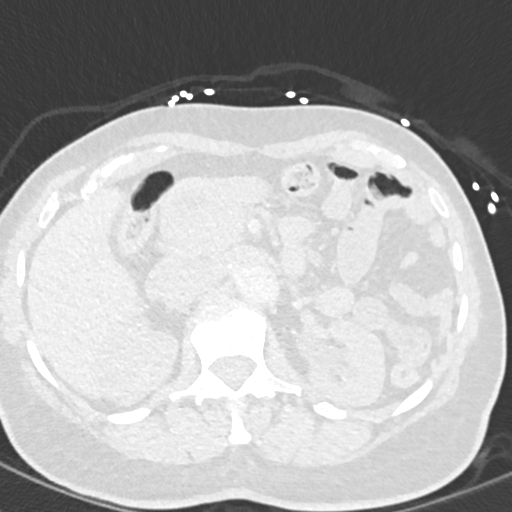
[im 51/405  mediastinal]
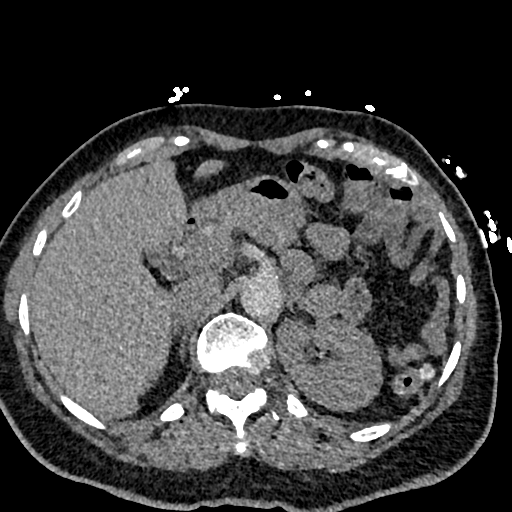
[im 76/405  lung]
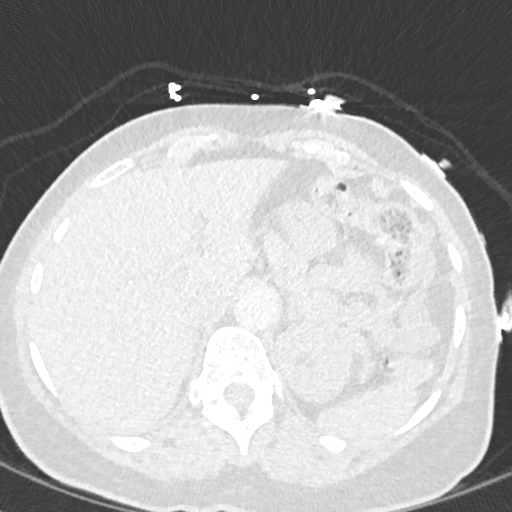
[im 102/405  mediastinal]
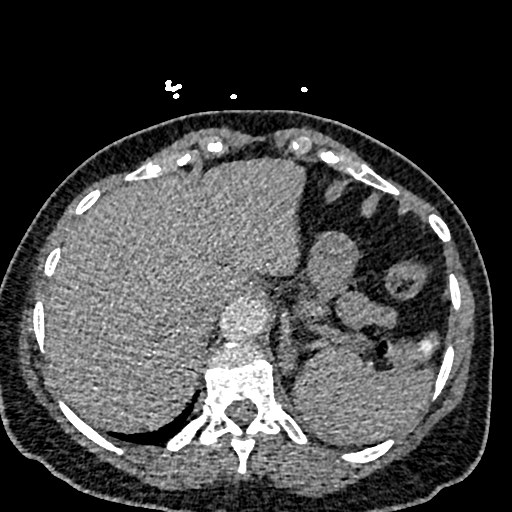
[im 127/405  lung]
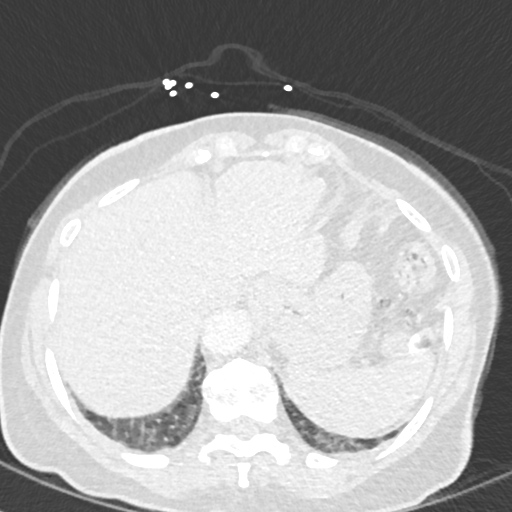
[im 152/405  mediastinal]
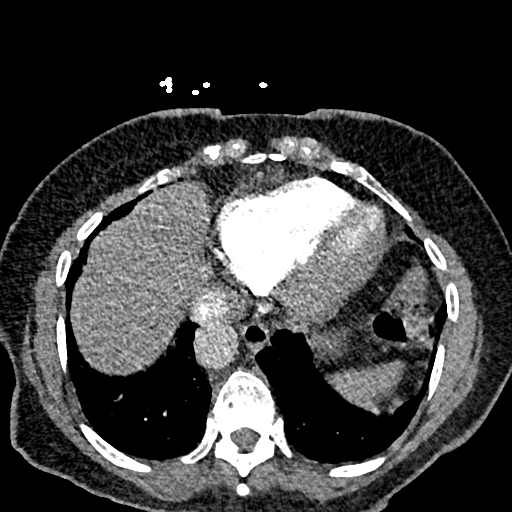
[im 177/405  lung]
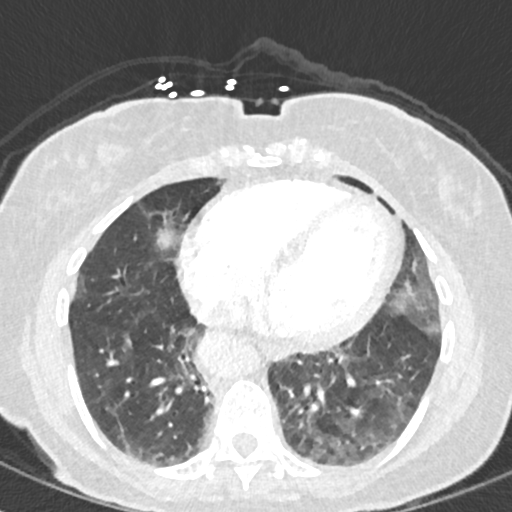
[im 203/405  mediastinal]
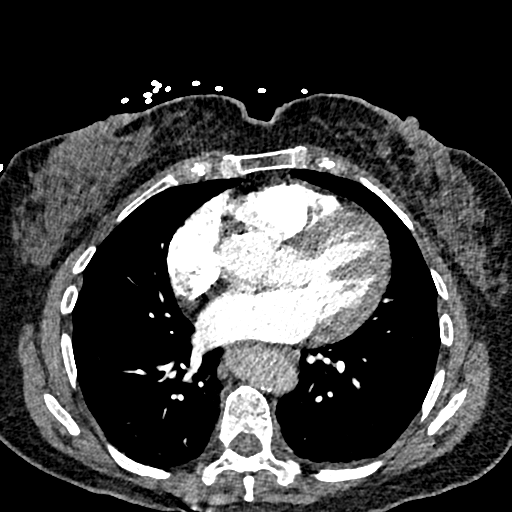
[im 228/405  lung]
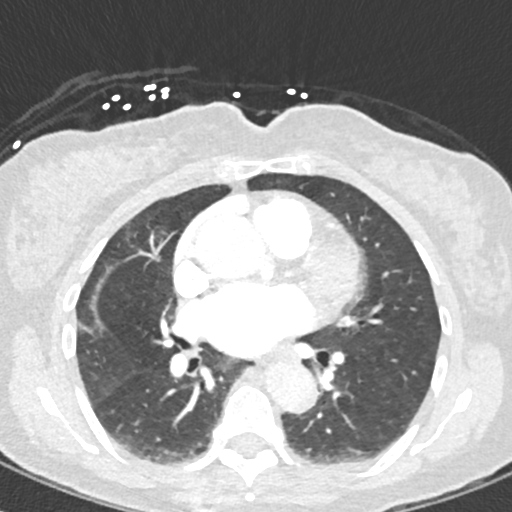
[im 253/405  mediastinal]
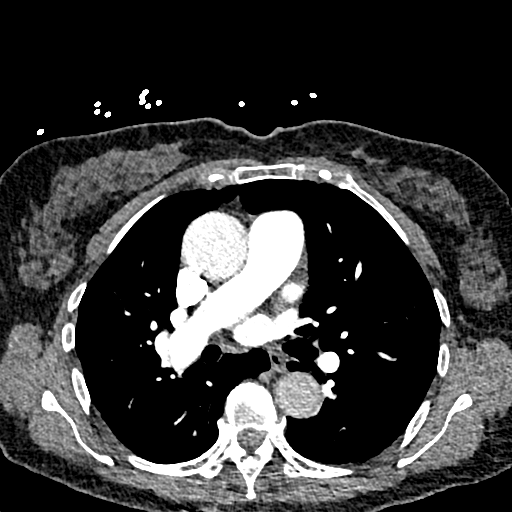
[im 278/405  lung]
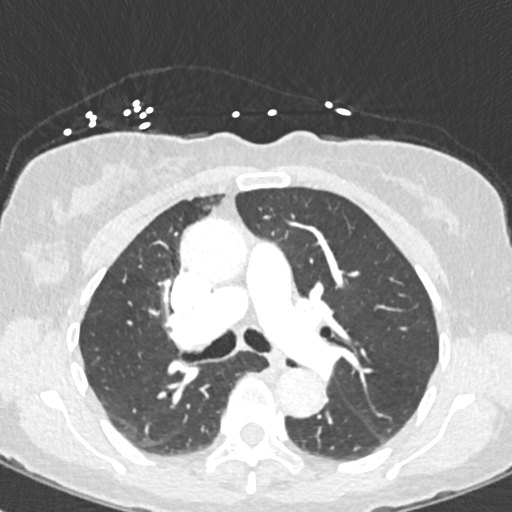
[im 304/405  mediastinal]
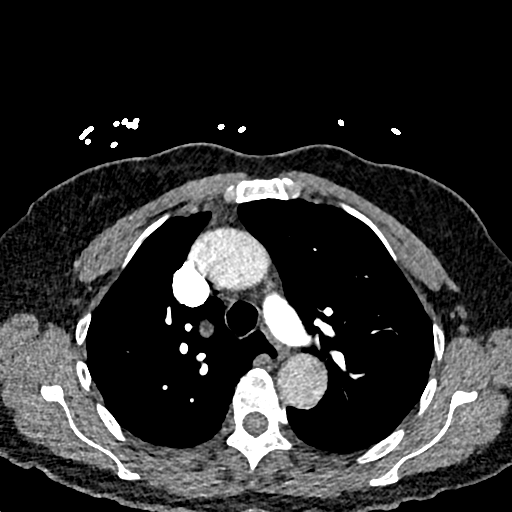
[im 329/405  lung]
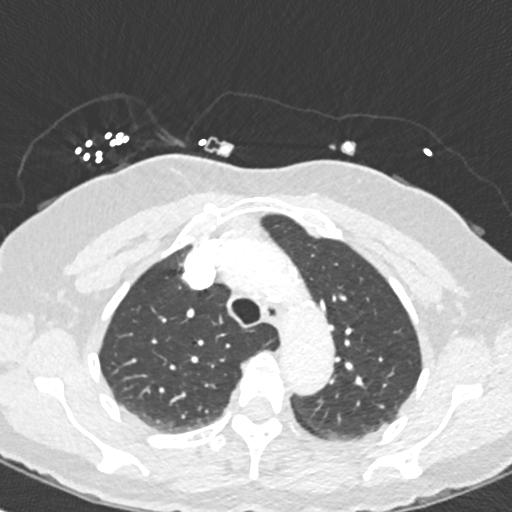
[im 354/405  mediastinal]
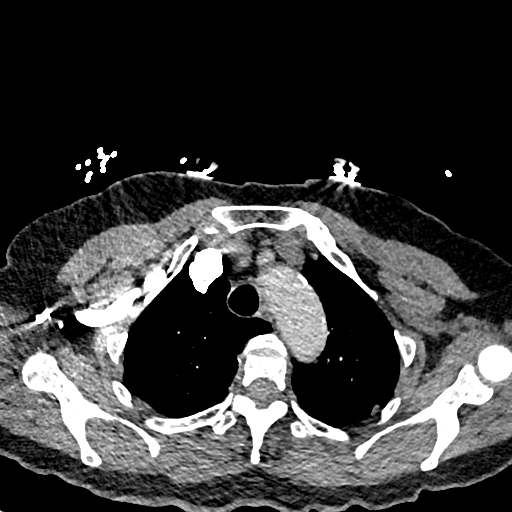
[im 379/405  lung]
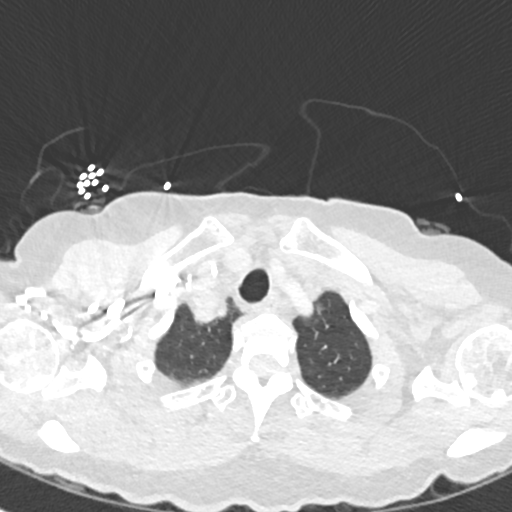

[Series 8: pe 2mm cor · coronal · 0.55mm/px · 1 of 115 slices shown]
[im 58/115  mediastinal]
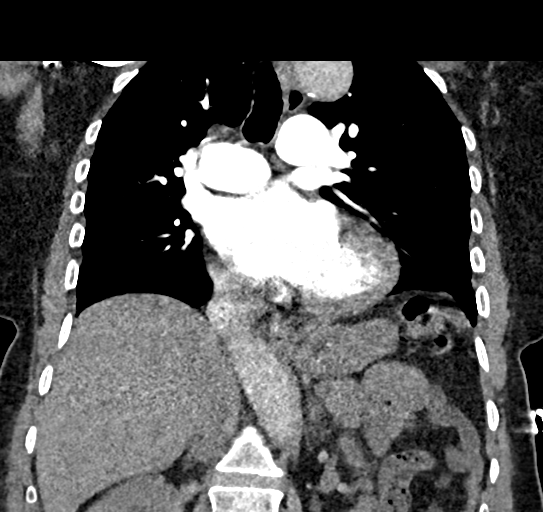

[18 of 36 positions shown; findings below may reference images not displayed]

RADIATION DOSE REDUCTION: This exam was performed according to the
departmental dose-optimization program which includes automated
exposure control, adjustment of the mA and/or kV according to
patient size and/or use of iterative reconstruction technique.

CONTRAST:  75mL OMNIPAQUE IOHEXOL 350 MG/ML SOLN
FINDINGS: Cardiovascular: Pulmonary arteries are well opacified. There is no
evidence of a pulmonary embolism.

Heart is borderline enlarged. No pericardial effusion. Ascending
thoracic aorta measures 4 cm in diameter. No aortic dissection. Mild
aortic atherosclerosis.

Mediastinum/Nodes: The no neck base, mediastinal or hilar masses or
enlarged lymph nodes. Trachea and esophagus are unremarkable.

Lungs/Pleura: Lung bases show reticular opacities with intervening
ground-glass opacities. Mild focus of hazy opacity and reticular
opacities are noted in the right upper lobe anteriorly and medially.
Mild areas of interstitial thickening, most evident in the lower
lungs. No lung consolidation. No evidence of pulmonary edema. No
pleural effusion or pneumothorax.

Upper Abdomen: No acute findings.  Small left intrarenal stone.

Musculoskeletal: No fracture or acute finding.  No bone lesion.

Review of the MIP images confirms the above findings.
IMPRESSION: 1. No evidence of a pulmonary embolism.
2. No convincing acute findings. Lungs show basilar areas linear and
reticular opacities with mild intervening ground-glass opacities
suspected to be a combination of mild fibrosis and atelectasis.
3. Ascending aorta dilated to 4 cm. Recommend annual imaging
followup by CTA or MRA. This recommendation follows 2939
ACCF/AHA/AATS/ACR/ASA/SCA/DATABEX/PLAZA/TONTXU/SAMMANTHA Guidelines for the
Diagnosis and Management of Patients with Thoracic Aortic Disease.
Circulation. 2939; 121: [id number]. Aortic aneurysm NOS (NEM3Y-OPK.R)

Aortic Atherosclerosis (NEM3Y-123.3).

## 2022-04-21 IMAGING — CR DG SHOULDER 2+V*L*
4 series · 4 of 4 positions shown · non-contrast
Comparison: None.

CLINICAL DATA: Left neck and arm pain status post injury

EXAM:
LEFT SHOULDER - 2+ VIEW

[shoulder grashey]
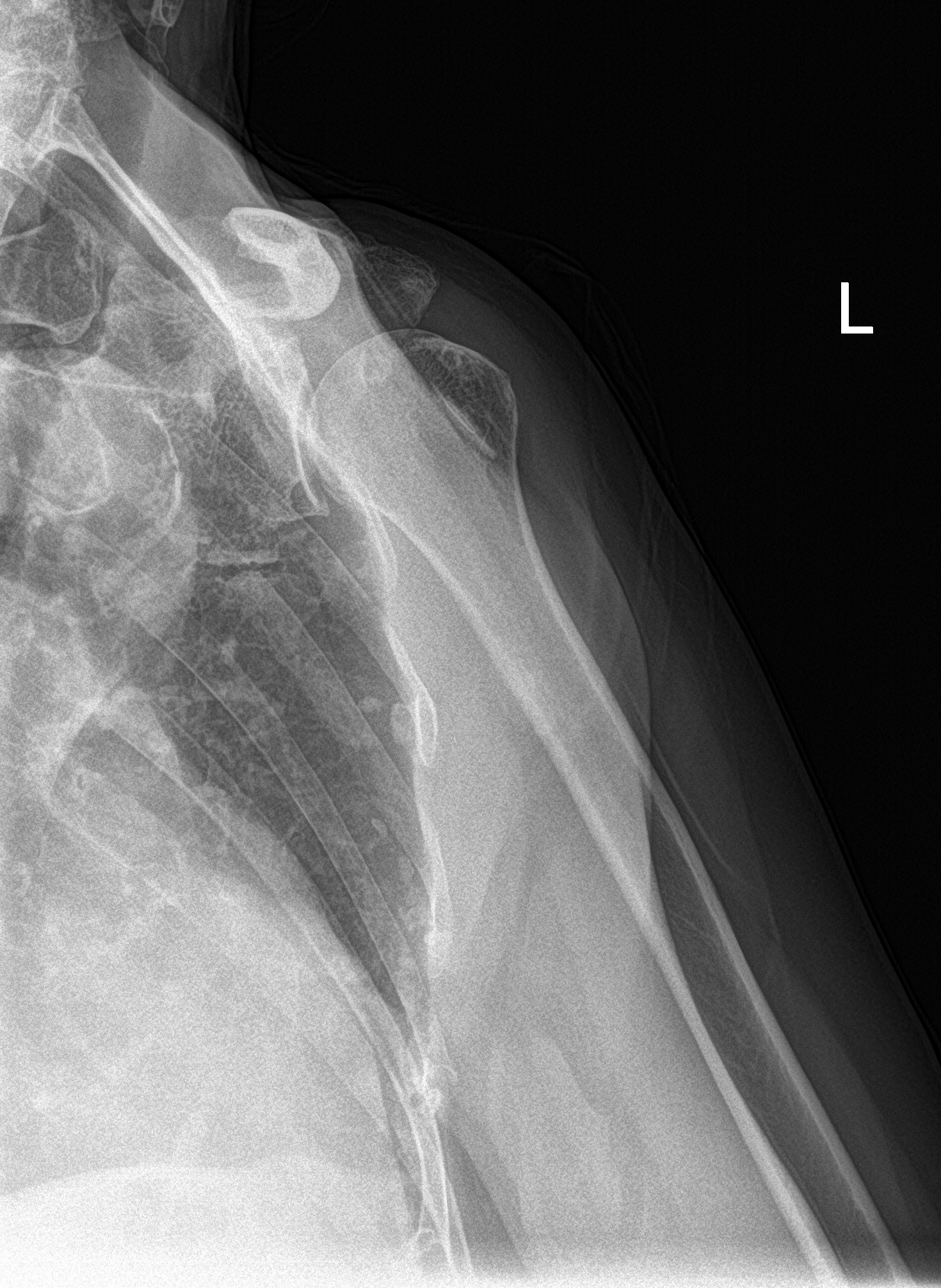

[shoulder axillary]
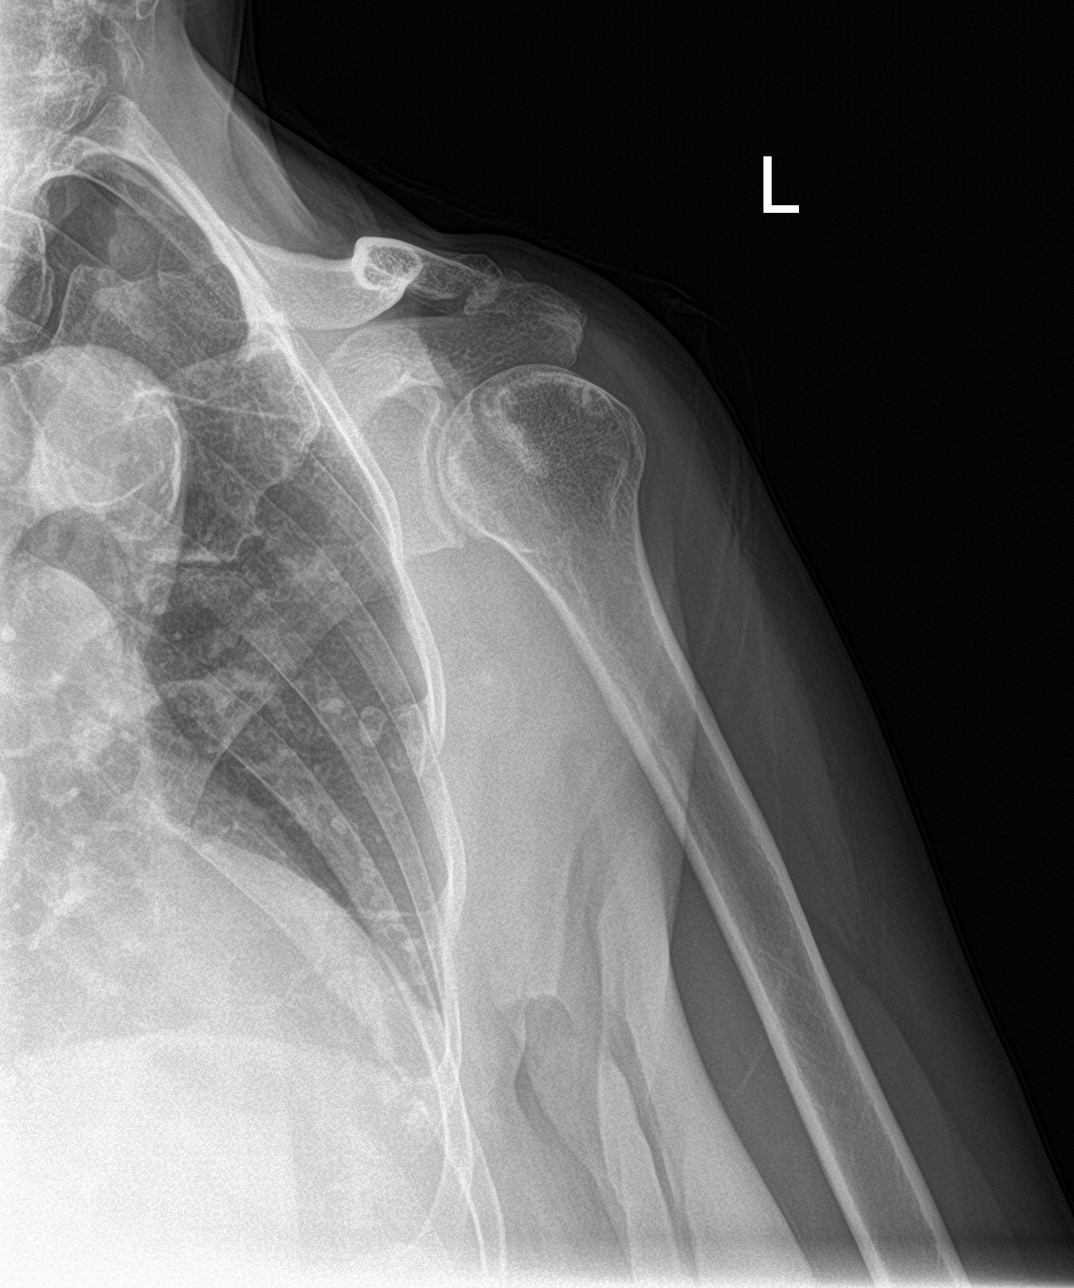

[shoulder ap neutral]
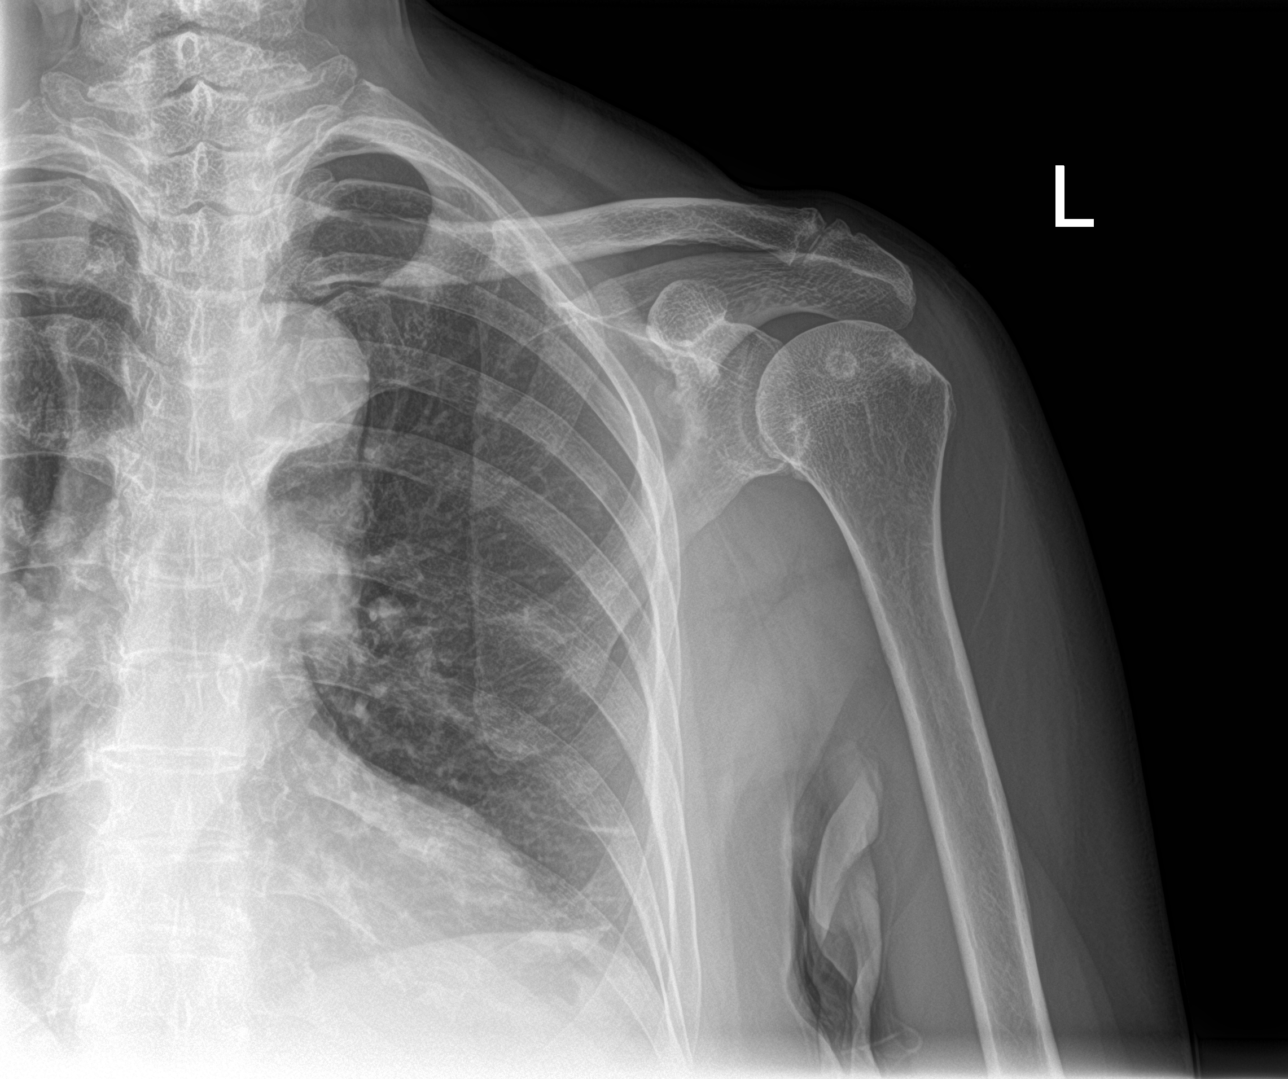

[shoulder y view]
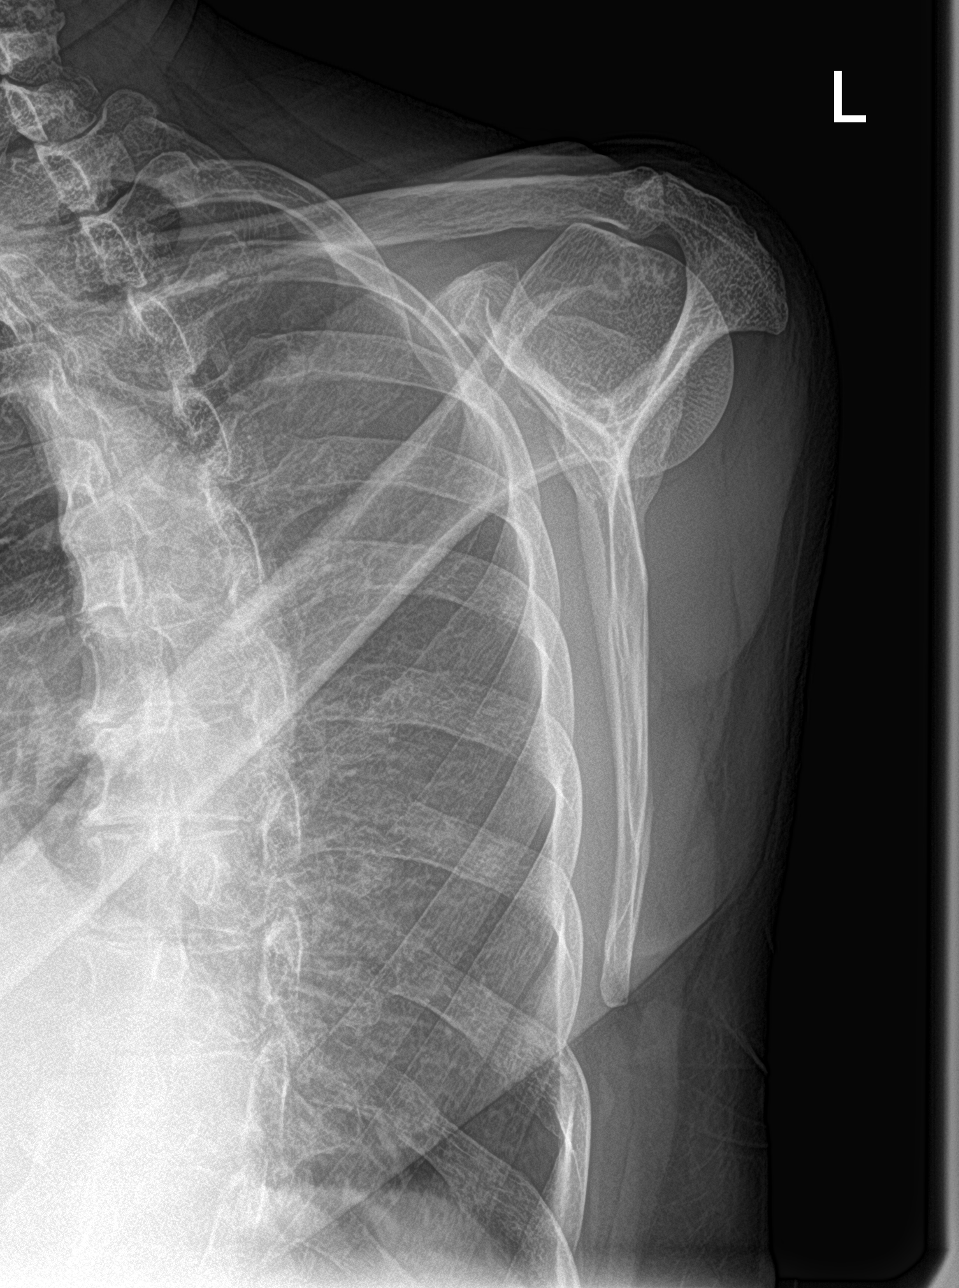

[4 of 4 positions shown; findings below may reference images not displayed]

FINDINGS: Mild spurring and joint space loss of the left acromioclavicular
joint. Irregularity of the greater tuberosity consistent with
rotator cuff tendinopathy. Mild spurring of the glenohumeral joint.
IMPRESSION: No acute fracture or dislocation of the left shoulder.

## 2022-05-06 DIAGNOSIS — M810 Age-related osteoporosis without current pathological fracture: Secondary | ICD-10-CM | POA: Diagnosis not present

## 2022-05-06 DIAGNOSIS — I10 Essential (primary) hypertension: Secondary | ICD-10-CM | POA: Diagnosis not present

## 2022-05-06 DIAGNOSIS — D649 Anemia, unspecified: Secondary | ICD-10-CM | POA: Diagnosis not present

## 2022-05-06 DIAGNOSIS — Z0000 Encounter for general adult medical examination without abnormal findings: Secondary | ICD-10-CM | POA: Diagnosis not present

## 2022-05-06 DIAGNOSIS — E7800 Pure hypercholesterolemia, unspecified: Secondary | ICD-10-CM | POA: Diagnosis not present

## 2022-06-11 DIAGNOSIS — H40033 Anatomical narrow angle, bilateral: Secondary | ICD-10-CM | POA: Diagnosis not present

## 2022-06-11 DIAGNOSIS — H524 Presbyopia: Secondary | ICD-10-CM | POA: Diagnosis not present

## 2022-06-11 DIAGNOSIS — H52213 Irregular astigmatism, bilateral: Secondary | ICD-10-CM | POA: Diagnosis not present

## 2022-06-11 DIAGNOSIS — H2513 Age-related nuclear cataract, bilateral: Secondary | ICD-10-CM | POA: Diagnosis not present

## 2022-06-11 DIAGNOSIS — H25013 Cortical age-related cataract, bilateral: Secondary | ICD-10-CM | POA: Diagnosis not present

## 2022-06-11 DIAGNOSIS — H2512 Age-related nuclear cataract, left eye: Secondary | ICD-10-CM | POA: Diagnosis not present

## 2022-06-11 DIAGNOSIS — H40013 Open angle with borderline findings, low risk, bilateral: Secondary | ICD-10-CM | POA: Diagnosis not present

## 2022-08-12 DIAGNOSIS — H25013 Cortical age-related cataract, bilateral: Secondary | ICD-10-CM | POA: Diagnosis not present

## 2022-08-12 DIAGNOSIS — H25812 Combined forms of age-related cataract, left eye: Secondary | ICD-10-CM | POA: Diagnosis not present

## 2022-08-12 DIAGNOSIS — H2512 Age-related nuclear cataract, left eye: Secondary | ICD-10-CM | POA: Diagnosis not present

## 2022-08-12 DIAGNOSIS — H2513 Age-related nuclear cataract, bilateral: Secondary | ICD-10-CM | POA: Diagnosis not present

## 2022-09-10 DIAGNOSIS — Z1231 Encounter for screening mammogram for malignant neoplasm of breast: Secondary | ICD-10-CM | POA: Diagnosis not present

## 2022-09-30 DIAGNOSIS — M810 Age-related osteoporosis without current pathological fracture: Secondary | ICD-10-CM | POA: Diagnosis not present

## 2022-09-30 DIAGNOSIS — Z780 Asymptomatic menopausal state: Secondary | ICD-10-CM | POA: Diagnosis not present

## 2022-09-30 DIAGNOSIS — Z87311 Personal history of (healed) other pathological fracture: Secondary | ICD-10-CM | POA: Diagnosis not present

## 2022-09-30 DIAGNOSIS — Z5181 Encounter for therapeutic drug level monitoring: Secondary | ICD-10-CM | POA: Diagnosis not present

## 2022-09-30 DIAGNOSIS — Z9181 History of falling: Secondary | ICD-10-CM | POA: Diagnosis not present

## 2022-09-30 DIAGNOSIS — Z79620 Long term (current) use of immunosuppressive biologic: Secondary | ICD-10-CM | POA: Diagnosis not present

## 2022-09-30 DIAGNOSIS — Z8781 Personal history of (healed) traumatic fracture: Secondary | ICD-10-CM | POA: Diagnosis not present

## 2022-10-01 DIAGNOSIS — H2511 Age-related nuclear cataract, right eye: Secondary | ICD-10-CM | POA: Diagnosis not present

## 2022-10-01 DIAGNOSIS — H25011 Cortical age-related cataract, right eye: Secondary | ICD-10-CM | POA: Diagnosis not present

## 2022-10-07 DIAGNOSIS — H2511 Age-related nuclear cataract, right eye: Secondary | ICD-10-CM | POA: Diagnosis not present

## 2022-10-07 DIAGNOSIS — H25011 Cortical age-related cataract, right eye: Secondary | ICD-10-CM | POA: Diagnosis not present

## 2022-10-07 DIAGNOSIS — H25811 Combined forms of age-related cataract, right eye: Secondary | ICD-10-CM | POA: Diagnosis not present

## 2022-10-08 DIAGNOSIS — Z780 Asymptomatic menopausal state: Secondary | ICD-10-CM | POA: Diagnosis not present

## 2022-10-08 DIAGNOSIS — M8589 Other specified disorders of bone density and structure, multiple sites: Secondary | ICD-10-CM | POA: Diagnosis not present

## 2022-10-28 DIAGNOSIS — M9903 Segmental and somatic dysfunction of lumbar region: Secondary | ICD-10-CM | POA: Diagnosis not present

## 2022-10-28 DIAGNOSIS — M9904 Segmental and somatic dysfunction of sacral region: Secondary | ICD-10-CM | POA: Diagnosis not present

## 2022-10-28 DIAGNOSIS — M9905 Segmental and somatic dysfunction of pelvic region: Secondary | ICD-10-CM | POA: Diagnosis not present

## 2022-10-28 DIAGNOSIS — M9901 Segmental and somatic dysfunction of cervical region: Secondary | ICD-10-CM | POA: Diagnosis not present

## 2022-10-29 ENCOUNTER — Ambulatory Visit (INDEPENDENT_AMBULATORY_CARE_PROVIDER_SITE_OTHER): Payer: Medicare Other

## 2022-10-29 ENCOUNTER — Encounter: Payer: Self-pay | Admitting: Podiatry

## 2022-10-29 ENCOUNTER — Ambulatory Visit: Payer: Medicare Other | Admitting: Podiatry

## 2022-10-29 VITALS — BP 156/86

## 2022-10-29 DIAGNOSIS — M21622 Bunionette of left foot: Secondary | ICD-10-CM | POA: Diagnosis not present

## 2022-10-29 DIAGNOSIS — M21619 Bunion of unspecified foot: Secondary | ICD-10-CM

## 2022-10-29 DIAGNOSIS — M7751 Other enthesopathy of right foot: Secondary | ICD-10-CM

## 2022-10-29 DIAGNOSIS — M21612 Bunion of left foot: Secondary | ICD-10-CM

## 2022-10-29 DIAGNOSIS — L84 Corns and callosities: Secondary | ICD-10-CM | POA: Diagnosis not present

## 2022-10-29 DIAGNOSIS — M7752 Other enthesopathy of left foot: Secondary | ICD-10-CM | POA: Diagnosis not present

## 2022-10-29 DIAGNOSIS — M21621 Bunionette of right foot: Secondary | ICD-10-CM | POA: Diagnosis not present

## 2022-10-29 DIAGNOSIS — M21611 Bunion of right foot: Secondary | ICD-10-CM

## 2022-10-29 MED ORDER — TRIAMCINOLONE ACETONIDE 10 MG/ML IJ SUSP
20.0000 mg | Freq: Once | INTRAMUSCULAR | Status: AC
  Administered 2022-10-29: 20 mg

## 2022-10-29 NOTE — Progress Notes (Signed)
Subjective:   Patient ID: Gloria Whitney, female   DOB: 82 y.o.   MRN: [id number]   HPI Patient states she is developed a lot of pain between the big toe second toe of both feet and has lesions on both feet which have been very bothersome for her.  States that she has noted she has had bad bunions for a long time but this 1 on the left foot between the 2 toes has become increasingly sore recently.  Patient does not smoke likes to be active   Review of Systems  All other systems reviewed and are negative.       Objective:  Physical Exam Vitals and nursing note reviewed.  Constitutional:      Appearance: She is well-developed.  Pulmonary:     Effort: Pulmonary effort is normal.  Musculoskeletal:        General: Normal range of motion.  Skin:    General: Skin is warm.  Neurological:     Mental Status: She is alert.     Neurovascular status intact muscle strength found to be adequate range of motion adequate.  Patient is noted to have severe bunion deformity left over right deviation of the big toe against the second toe with fluid buildup around the inner phalangeal joint left big toe.  Lesion subfifth metatarsal subfirst metatarsal and on the second digit bilateral that are painful.  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  Fluid buildup around the inner phalangeal joint left big toe     Plan:  H&P x-rays reviewed all conditions discussed.  At this point I want to try conservative before we will consider surgical intervention which may be possible but I went ahead did sterile prep and injected the inner phalangeal joint left big toe 2 mg dexamethasone Kenalog 5 mg Xylocaine debrided lesion subfifth metatarsal subfirst metatarsal and advised on wider shoes.  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

## 2022-11-04 DIAGNOSIS — M542 Cervicalgia: Secondary | ICD-10-CM | POA: Diagnosis not present

## 2022-11-07 DIAGNOSIS — M79603 Pain in arm, unspecified: Secondary | ICD-10-CM | POA: Diagnosis not present

## 2022-11-07 DIAGNOSIS — H35033 Hypertensive retinopathy, bilateral: Secondary | ICD-10-CM | POA: Diagnosis not present

## 2022-11-07 DIAGNOSIS — I10 Essential (primary) hypertension: Secondary | ICD-10-CM | POA: Diagnosis not present

## 2022-11-07 DIAGNOSIS — M810 Age-related osteoporosis without current pathological fracture: Secondary | ICD-10-CM | POA: Diagnosis not present

## 2022-11-07 DIAGNOSIS — D649 Anemia, unspecified: Secondary | ICD-10-CM | POA: Diagnosis not present

## 2022-11-07 DIAGNOSIS — E7800 Pure hypercholesterolemia, unspecified: Secondary | ICD-10-CM | POA: Diagnosis not present

## 2022-11-18 DIAGNOSIS — M9901 Segmental and somatic dysfunction of cervical region: Secondary | ICD-10-CM | POA: Diagnosis not present

## 2022-11-18 DIAGNOSIS — M9905 Segmental and somatic dysfunction of pelvic region: Secondary | ICD-10-CM | POA: Diagnosis not present

## 2022-11-18 DIAGNOSIS — M9904 Segmental and somatic dysfunction of sacral region: Secondary | ICD-10-CM | POA: Diagnosis not present

## 2022-11-18 DIAGNOSIS — M9903 Segmental and somatic dysfunction of lumbar region: Secondary | ICD-10-CM | POA: Diagnosis not present

## 2022-12-09 DIAGNOSIS — M9904 Segmental and somatic dysfunction of sacral region: Secondary | ICD-10-CM | POA: Diagnosis not present

## 2022-12-09 DIAGNOSIS — M9903 Segmental and somatic dysfunction of lumbar region: Secondary | ICD-10-CM | POA: Diagnosis not present

## 2022-12-09 DIAGNOSIS — W19XXXA Unspecified fall, initial encounter: Secondary | ICD-10-CM | POA: Diagnosis not present

## 2022-12-09 DIAGNOSIS — M2170 Unequal limb length (acquired), unspecified site: Secondary | ICD-10-CM | POA: Diagnosis not present

## 2022-12-09 DIAGNOSIS — M9905 Segmental and somatic dysfunction of pelvic region: Secondary | ICD-10-CM | POA: Diagnosis not present

## 2022-12-09 DIAGNOSIS — R42 Dizziness and giddiness: Secondary | ICD-10-CM | POA: Diagnosis not present

## 2022-12-09 DIAGNOSIS — M9901 Segmental and somatic dysfunction of cervical region: Secondary | ICD-10-CM | POA: Diagnosis not present

## 2022-12-17 ENCOUNTER — Ambulatory Visit: Payer: Medicare Other | Admitting: Podiatry

## 2022-12-17 DIAGNOSIS — M7752 Other enthesopathy of left foot: Secondary | ICD-10-CM

## 2022-12-17 DIAGNOSIS — M21621 Bunionette of right foot: Secondary | ICD-10-CM

## 2022-12-17 DIAGNOSIS — M21619 Bunion of unspecified foot: Secondary | ICD-10-CM

## 2022-12-17 DIAGNOSIS — M7751 Other enthesopathy of right foot: Secondary | ICD-10-CM

## 2022-12-17 DIAGNOSIS — M21622 Bunionette of left foot: Secondary | ICD-10-CM | POA: Diagnosis not present

## 2022-12-17 NOTE — Progress Notes (Signed)
Subjective:   Patient ID: Gloria Whitney, female   DOB: 82 y.o.   MRN: [id number]   HPI Patient states doing much better states that she is not sure about surgery and states she does have some callus formation back but nowhere near what she had previously   ROS      Objective:  Physical Exam  Neurovascular status intact with significant carotic bunion deformity bilateral digital deformity left over right with keratotic lesions inflammation improved     Assessment:  Structural deformity that is getting better bunion deformity lesion formation bilateral     Plan:  H&P reviewed at good length the causes of the problems and the mechanical dysfunction associated with her feet.  I do think ultimately that digital correction structural bunion correction may be necessary but organ to hold off currently try conservative care see how she does and then make a decision on what might be necessary with education rendered today concerning permanent type procedures.  Patient will be seen back as needed with courtesy debridement accomplished today and advised on wider shoes mesh materials

## 2022-12-31 DIAGNOSIS — M9904 Segmental and somatic dysfunction of sacral region: Secondary | ICD-10-CM | POA: Diagnosis not present

## 2022-12-31 DIAGNOSIS — M9905 Segmental and somatic dysfunction of pelvic region: Secondary | ICD-10-CM | POA: Diagnosis not present

## 2022-12-31 DIAGNOSIS — M9903 Segmental and somatic dysfunction of lumbar region: Secondary | ICD-10-CM | POA: Diagnosis not present

## 2022-12-31 DIAGNOSIS — M9901 Segmental and somatic dysfunction of cervical region: Secondary | ICD-10-CM | POA: Diagnosis not present

## 2023-01-07 DIAGNOSIS — H40013 Open angle with borderline findings, low risk, bilateral: Secondary | ICD-10-CM | POA: Diagnosis not present

## 2023-01-20 DIAGNOSIS — M9905 Segmental and somatic dysfunction of pelvic region: Secondary | ICD-10-CM | POA: Diagnosis not present

## 2023-01-20 DIAGNOSIS — M9904 Segmental and somatic dysfunction of sacral region: Secondary | ICD-10-CM | POA: Diagnosis not present

## 2023-01-20 DIAGNOSIS — M9903 Segmental and somatic dysfunction of lumbar region: Secondary | ICD-10-CM | POA: Diagnosis not present

## 2023-01-20 DIAGNOSIS — M9901 Segmental and somatic dysfunction of cervical region: Secondary | ICD-10-CM | POA: Diagnosis not present

## 2023-02-10 DIAGNOSIS — M9904 Segmental and somatic dysfunction of sacral region: Secondary | ICD-10-CM | POA: Diagnosis not present

## 2023-02-10 DIAGNOSIS — M9903 Segmental and somatic dysfunction of lumbar region: Secondary | ICD-10-CM | POA: Diagnosis not present

## 2023-02-10 DIAGNOSIS — M9901 Segmental and somatic dysfunction of cervical region: Secondary | ICD-10-CM | POA: Diagnosis not present

## 2023-02-10 DIAGNOSIS — M9905 Segmental and somatic dysfunction of pelvic region: Secondary | ICD-10-CM | POA: Diagnosis not present

## 2023-03-03 DIAGNOSIS — M9903 Segmental and somatic dysfunction of lumbar region: Secondary | ICD-10-CM | POA: Diagnosis not present

## 2023-03-03 DIAGNOSIS — M9905 Segmental and somatic dysfunction of pelvic region: Secondary | ICD-10-CM | POA: Diagnosis not present

## 2023-03-03 DIAGNOSIS — M9904 Segmental and somatic dysfunction of sacral region: Secondary | ICD-10-CM | POA: Diagnosis not present

## 2023-03-03 DIAGNOSIS — M9901 Segmental and somatic dysfunction of cervical region: Secondary | ICD-10-CM | POA: Diagnosis not present

## 2023-03-24 DIAGNOSIS — M9904 Segmental and somatic dysfunction of sacral region: Secondary | ICD-10-CM | POA: Diagnosis not present

## 2023-03-24 DIAGNOSIS — M9903 Segmental and somatic dysfunction of lumbar region: Secondary | ICD-10-CM | POA: Diagnosis not present

## 2023-03-24 DIAGNOSIS — M9901 Segmental and somatic dysfunction of cervical region: Secondary | ICD-10-CM | POA: Diagnosis not present

## 2023-03-24 DIAGNOSIS — M9905 Segmental and somatic dysfunction of pelvic region: Secondary | ICD-10-CM | POA: Diagnosis not present

## 2023-04-01 DIAGNOSIS — M810 Age-related osteoporosis without current pathological fracture: Secondary | ICD-10-CM | POA: Diagnosis not present

## 2023-04-01 DIAGNOSIS — Z5181 Encounter for therapeutic drug level monitoring: Secondary | ICD-10-CM | POA: Diagnosis not present

## 2023-04-14 DIAGNOSIS — M9904 Segmental and somatic dysfunction of sacral region: Secondary | ICD-10-CM | POA: Diagnosis not present

## 2023-04-14 DIAGNOSIS — M9901 Segmental and somatic dysfunction of cervical region: Secondary | ICD-10-CM | POA: Diagnosis not present

## 2023-04-14 DIAGNOSIS — M9905 Segmental and somatic dysfunction of pelvic region: Secondary | ICD-10-CM | POA: Diagnosis not present

## 2023-04-14 DIAGNOSIS — M9903 Segmental and somatic dysfunction of lumbar region: Secondary | ICD-10-CM | POA: Diagnosis not present

## 2023-05-05 DIAGNOSIS — M9901 Segmental and somatic dysfunction of cervical region: Secondary | ICD-10-CM | POA: Diagnosis not present

## 2023-05-05 DIAGNOSIS — M9905 Segmental and somatic dysfunction of pelvic region: Secondary | ICD-10-CM | POA: Diagnosis not present

## 2023-05-05 DIAGNOSIS — M9904 Segmental and somatic dysfunction of sacral region: Secondary | ICD-10-CM | POA: Diagnosis not present

## 2023-05-05 DIAGNOSIS — M9903 Segmental and somatic dysfunction of lumbar region: Secondary | ICD-10-CM | POA: Diagnosis not present

## 2023-05-06 DIAGNOSIS — H04123 Dry eye syndrome of bilateral lacrimal glands: Secondary | ICD-10-CM | POA: Diagnosis not present

## 2023-05-06 DIAGNOSIS — H40013 Open angle with borderline findings, low risk, bilateral: Secondary | ICD-10-CM | POA: Diagnosis not present

## 2023-05-06 DIAGNOSIS — H35033 Hypertensive retinopathy, bilateral: Secondary | ICD-10-CM | POA: Diagnosis not present

## 2023-05-06 DIAGNOSIS — H43813 Vitreous degeneration, bilateral: Secondary | ICD-10-CM | POA: Diagnosis not present

## 2023-05-19 DIAGNOSIS — M25519 Pain in unspecified shoulder: Secondary | ICD-10-CM | POA: Diagnosis not present

## 2023-05-19 DIAGNOSIS — D649 Anemia, unspecified: Secondary | ICD-10-CM | POA: Diagnosis not present

## 2023-05-19 DIAGNOSIS — M810 Age-related osteoporosis without current pathological fracture: Secondary | ICD-10-CM | POA: Diagnosis not present

## 2023-05-19 DIAGNOSIS — Z5181 Encounter for therapeutic drug level monitoring: Secondary | ICD-10-CM | POA: Diagnosis not present

## 2023-05-19 DIAGNOSIS — E7800 Pure hypercholesterolemia, unspecified: Secondary | ICD-10-CM | POA: Diagnosis not present

## 2023-05-19 DIAGNOSIS — Z0000 Encounter for general adult medical examination without abnormal findings: Secondary | ICD-10-CM | POA: Diagnosis not present

## 2023-05-19 DIAGNOSIS — I10 Essential (primary) hypertension: Secondary | ICD-10-CM | POA: Diagnosis not present

## 2023-05-19 DIAGNOSIS — Z23 Encounter for immunization: Secondary | ICD-10-CM | POA: Diagnosis not present

## 2023-05-19 DIAGNOSIS — I77810 Thoracic aortic ectasia: Secondary | ICD-10-CM | POA: Diagnosis not present

## 2023-05-19 DIAGNOSIS — E8770 Fluid overload, unspecified: Secondary | ICD-10-CM | POA: Diagnosis not present

## 2023-05-26 DIAGNOSIS — M9905 Segmental and somatic dysfunction of pelvic region: Secondary | ICD-10-CM | POA: Diagnosis not present

## 2023-05-26 DIAGNOSIS — M9901 Segmental and somatic dysfunction of cervical region: Secondary | ICD-10-CM | POA: Diagnosis not present

## 2023-05-26 DIAGNOSIS — M9903 Segmental and somatic dysfunction of lumbar region: Secondary | ICD-10-CM | POA: Diagnosis not present

## 2023-05-26 DIAGNOSIS — M9904 Segmental and somatic dysfunction of sacral region: Secondary | ICD-10-CM | POA: Diagnosis not present

## 2023-05-27 ENCOUNTER — Other Ambulatory Visit: Payer: Self-pay | Admitting: Internal Medicine

## 2023-05-27 DIAGNOSIS — I77810 Thoracic aortic ectasia: Secondary | ICD-10-CM

## 2023-06-16 DIAGNOSIS — M9903 Segmental and somatic dysfunction of lumbar region: Secondary | ICD-10-CM | POA: Diagnosis not present

## 2023-06-16 DIAGNOSIS — M9904 Segmental and somatic dysfunction of sacral region: Secondary | ICD-10-CM | POA: Diagnosis not present

## 2023-06-16 DIAGNOSIS — M9905 Segmental and somatic dysfunction of pelvic region: Secondary | ICD-10-CM | POA: Diagnosis not present

## 2023-06-16 DIAGNOSIS — M9901 Segmental and somatic dysfunction of cervical region: Secondary | ICD-10-CM | POA: Diagnosis not present

## 2023-06-23 ENCOUNTER — Telehealth: Payer: Self-pay

## 2023-06-23 ENCOUNTER — Ambulatory Visit
Admission: RE | Admit: 2023-06-23 | Discharge: 2023-06-23 | Disposition: A | Discharge status: Home / Self Care | Payer: Medicare Other | Source: Ambulatory Visit | Attending: Internal Medicine | Admitting: Internal Medicine

## 2023-06-23 DIAGNOSIS — I77810 Thoracic aortic ectasia: Secondary | ICD-10-CM

## 2023-06-23 NOTE — Telephone Encounter (Signed)
Pt to take 50 mg of prednisone on 06/24/23 at 7:00PM, 50 mg of prednisone on 06/25/23 at 1:00AM, and 50 mg of prednisone on 06/25/23 at 7:00AM. Pt is also to take 50 mg of benadryl on 06/25/23 at 7:00AM. Please call [phone] with any questions.

## 2023-06-25 ENCOUNTER — Ambulatory Visit
Admission: RE | Admit: 2023-06-25 | Discharge: 2023-06-25 | Disposition: A | Discharge status: Home / Self Care | Payer: Medicare Other | Source: Ambulatory Visit | Attending: Internal Medicine | Admitting: Internal Medicine

## 2023-06-25 DIAGNOSIS — R0609 Other forms of dyspnea: Secondary | ICD-10-CM | POA: Diagnosis not present

## 2023-06-25 MED ORDER — IOPAMIDOL (ISOVUE-370) INJECTION 76%
200.0000 mL | Freq: Once | INTRAVENOUS | Status: AC | PRN
  Administered 2023-06-25: 75 mL via INTRAVENOUS

## 2023-07-07 DIAGNOSIS — M9904 Segmental and somatic dysfunction of sacral region: Secondary | ICD-10-CM | POA: Diagnosis not present

## 2023-07-07 DIAGNOSIS — M9905 Segmental and somatic dysfunction of pelvic region: Secondary | ICD-10-CM | POA: Diagnosis not present

## 2023-07-07 DIAGNOSIS — M9903 Segmental and somatic dysfunction of lumbar region: Secondary | ICD-10-CM | POA: Diagnosis not present

## 2023-07-07 DIAGNOSIS — M9901 Segmental and somatic dysfunction of cervical region: Secondary | ICD-10-CM | POA: Diagnosis not present

## 2023-07-28 DIAGNOSIS — M9904 Segmental and somatic dysfunction of sacral region: Secondary | ICD-10-CM | POA: Diagnosis not present

## 2023-07-28 DIAGNOSIS — M9905 Segmental and somatic dysfunction of pelvic region: Secondary | ICD-10-CM | POA: Diagnosis not present

## 2023-07-28 DIAGNOSIS — M9903 Segmental and somatic dysfunction of lumbar region: Secondary | ICD-10-CM | POA: Diagnosis not present

## 2023-07-28 DIAGNOSIS — M9901 Segmental and somatic dysfunction of cervical region: Secondary | ICD-10-CM | POA: Diagnosis not present

## 2023-08-18 DIAGNOSIS — M9905 Segmental and somatic dysfunction of pelvic region: Secondary | ICD-10-CM | POA: Diagnosis not present

## 2023-08-18 DIAGNOSIS — M9904 Segmental and somatic dysfunction of sacral region: Secondary | ICD-10-CM | POA: Diagnosis not present

## 2023-08-18 DIAGNOSIS — M9901 Segmental and somatic dysfunction of cervical region: Secondary | ICD-10-CM | POA: Diagnosis not present

## 2023-08-18 DIAGNOSIS — M9903 Segmental and somatic dysfunction of lumbar region: Secondary | ICD-10-CM | POA: Diagnosis not present

## 2023-08-28 DIAGNOSIS — Z23 Encounter for immunization: Secondary | ICD-10-CM | POA: Diagnosis not present

## 2023-09-08 DIAGNOSIS — M9904 Segmental and somatic dysfunction of sacral region: Secondary | ICD-10-CM | POA: Diagnosis not present

## 2023-09-08 DIAGNOSIS — M9905 Segmental and somatic dysfunction of pelvic region: Secondary | ICD-10-CM | POA: Diagnosis not present

## 2023-09-08 DIAGNOSIS — M9901 Segmental and somatic dysfunction of cervical region: Secondary | ICD-10-CM | POA: Diagnosis not present

## 2023-09-08 DIAGNOSIS — M9903 Segmental and somatic dysfunction of lumbar region: Secondary | ICD-10-CM | POA: Diagnosis not present

## 2023-10-06 DIAGNOSIS — Z9181 History of falling: Secondary | ICD-10-CM | POA: Diagnosis not present

## 2023-10-06 DIAGNOSIS — M9903 Segmental and somatic dysfunction of lumbar region: Secondary | ICD-10-CM | POA: Diagnosis not present

## 2023-10-06 DIAGNOSIS — Z8781 Personal history of (healed) traumatic fracture: Secondary | ICD-10-CM | POA: Diagnosis not present

## 2023-10-06 DIAGNOSIS — M810 Age-related osteoporosis without current pathological fracture: Secondary | ICD-10-CM | POA: Diagnosis not present

## 2023-10-06 DIAGNOSIS — Z5181 Encounter for therapeutic drug level monitoring: Secondary | ICD-10-CM | POA: Diagnosis not present

## 2023-10-06 DIAGNOSIS — M9904 Segmental and somatic dysfunction of sacral region: Secondary | ICD-10-CM | POA: Diagnosis not present

## 2023-10-06 DIAGNOSIS — R5383 Other fatigue: Secondary | ICD-10-CM | POA: Insufficient documentation

## 2023-10-06 DIAGNOSIS — M9905 Segmental and somatic dysfunction of pelvic region: Secondary | ICD-10-CM | POA: Diagnosis not present

## 2023-10-06 DIAGNOSIS — M9901 Segmental and somatic dysfunction of cervical region: Secondary | ICD-10-CM | POA: Diagnosis not present

## 2023-10-07 DIAGNOSIS — N644 Mastodynia: Secondary | ICD-10-CM | POA: Diagnosis not present

## 2023-10-20 DIAGNOSIS — M9905 Segmental and somatic dysfunction of pelvic region: Secondary | ICD-10-CM | POA: Diagnosis not present

## 2023-10-20 DIAGNOSIS — M9904 Segmental and somatic dysfunction of sacral region: Secondary | ICD-10-CM | POA: Diagnosis not present

## 2023-10-20 DIAGNOSIS — M9903 Segmental and somatic dysfunction of lumbar region: Secondary | ICD-10-CM | POA: Diagnosis not present

## 2023-10-20 DIAGNOSIS — M9901 Segmental and somatic dysfunction of cervical region: Secondary | ICD-10-CM | POA: Diagnosis not present

## 2023-11-06 DIAGNOSIS — H04123 Dry eye syndrome of bilateral lacrimal glands: Secondary | ICD-10-CM | POA: Diagnosis not present

## 2023-11-06 DIAGNOSIS — H40013 Open angle with borderline findings, low risk, bilateral: Secondary | ICD-10-CM | POA: Diagnosis not present

## 2024-01-06 DIAGNOSIS — M2170 Unequal limb length (acquired), unspecified site: Secondary | ICD-10-CM | POA: Diagnosis not present

## 2024-01-06 DIAGNOSIS — R600 Localized edema: Secondary | ICD-10-CM | POA: Diagnosis not present

## 2024-01-06 DIAGNOSIS — R269 Unspecified abnormalities of gait and mobility: Secondary | ICD-10-CM | POA: Diagnosis not present

## 2024-02-02 DIAGNOSIS — I10 Essential (primary) hypertension: Secondary | ICD-10-CM | POA: Diagnosis not present

## 2024-02-02 DIAGNOSIS — E7800 Pure hypercholesterolemia, unspecified: Secondary | ICD-10-CM | POA: Diagnosis not present

## 2024-02-02 DIAGNOSIS — R0600 Dyspnea, unspecified: Secondary | ICD-10-CM | POA: Diagnosis not present

## 2024-02-02 DIAGNOSIS — M542 Cervicalgia: Secondary | ICD-10-CM | POA: Diagnosis not present

## 2024-02-02 DIAGNOSIS — R600 Localized edema: Secondary | ICD-10-CM | POA: Diagnosis not present

## 2024-02-15 ENCOUNTER — Ambulatory Visit: Admission: EM | Admit: 2024-02-15 | Discharge: 2024-02-15 | Disposition: A | Discharge status: Home / Self Care

## 2024-02-15 DIAGNOSIS — W19XXXA Unspecified fall, initial encounter: Secondary | ICD-10-CM

## 2024-02-15 DIAGNOSIS — S0181XA Laceration without foreign body of other part of head, initial encounter: Secondary | ICD-10-CM

## 2024-02-15 NOTE — ED Provider Notes (Signed)
 [facility]    CSN: [id number] Arrival date & time: 02/15/24  1559      History   Chief Complaint Chief Complaint  Patient presents with   Fall   Laceration    HPI Gloria Whitney is a 83 y.o. female.    Subjective:  Gloria Whitney is an 83 year old female who presents for evaluation of a laceration to the chin. She reports that the injury occurred around 3:45 PM today when she tripped on uneven concrete while walking, causing her to fall. She denies any coldness, numbness, pain, or paresthesias in the affected area. No dizziness, headache, normal visual disturbances, chest pain, shortness of breath or palpitations pre or post fall.  There were no additional injuries reported. She denies any head trauma, loss of consciousness, neck pain, or weakness. Her tetanus vaccination is up to date.  The following portions of the patient's history were reviewed and updated as appropriate: allergies, current medications, past family history, past medical history, past social history, past surgical history, and problem list.          Past Medical History:  Diagnosis Date   Hypertension     Patient Active Problem List   Diagnosis Date Noted   Fatigue 10/06/2023   Hoarseness 01/22/2022   Laryngopharyngeal reflux 01/22/2022   Bilateral chronic knee pain 09/17/2021   Closed nondisplaced fracture of medial condyle of right femur (HCC) 09/17/2020   Acute cholecystitis 02/10/2019   Medication monitoring encounter 02/16/2018   Primary osteoarthritis of left knee 12/30/2017   Hx of fracture of vertebral column 08/13/2016   At high risk for falls 01/24/2015   Compression fracture of vertebra, non-traumatic (HCC) 01/24/2015   Height loss 01/24/2015   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      Home Medications    Prior to Admission medications   Medication Sig Start Date End Date Taking? Authorizing Provider  chlorthalidone (HYGROTON) 25 MG tablet Take 25 mg by mouth daily. 02/02/24  Yes [provider]  Cholecalciferol (VITAMIN D-1000 MAX ST) 25 MCG (1000 UT) tablet Take 1,000 Units by mouth daily. 05/16/09  Yes [provider]  Ferrous Sulfate (IRON) 325 (65 Fe) MG TABS 1 tablet Orally Once a day As needed 09/17/21  Yes [provider]  Flaxseed, Linseed, (FLAX SEED OIL) 1000 MG CAPS Take by mouth. 02/16/18  Yes [provider]  furosemide (LASIX) 20 MG tablet Take 20 mg by mouth daily. 01/06/24  Yes [provider]  HYDROcodone -acetaminophen  (NORCO/VICODIN) 5-325 MG tablet Take 1-2 tablets by mouth every 6 (six) hours as needed. 09/15/20  Yes [provider]  Omega-3 1000 MG CAPS Take by mouth. 05/16/09  Yes [provider]  acetaminophen  (TYLENOL ) 500 MG tablet Take 1 tablet (500 mg total) by mouth every 6 (six) hours as needed. 11/16/21   Eloise Hake Scales, PA-C  amLODipine  (NORVASC ) 5 MG tablet Take 1 tablet (5 mg total) by mouth daily. 11/16/21 02/14/22  Eloise Hake Scales, PA-C  ascorbic acid (VITAMIN C) 1000 MG  tablet Take 1,000 mg by mouth daily.    [provider]  CRESTOR 5 MG tablet Take 5 mg by mouth daily.    [provider]  cyanocobalamin 100 MCG tablet Take 100 mcg by mouth daily.    [provider]  diclofenac  Sodium (VOLTAREN ) 1 % GEL Apply 4 g topically 4 (four) times daily. 11/16/21   Eloise Hake Scales, PA-C  doxycycline  (VIBRAMYCIN ) 100 MG capsule Take 1 capsule (100 mg total) by mouth 2  (two) times daily. 12/07/21   Auston Blush, MD  hydrochlorothiazide  (HYDRODIURIL ) 12.5 MG tablet Take 12.5 mg by mouth daily.    [provider]  Magnesium 250 MG TABS Take 250 mg by mouth daily.    [provider]  naproxen sodium (ALEVE) 220 MG tablet Take 220 mg by mouth 2 (two) times daily as needed.    [provider]  Omega 3 1000 MG CAPS 1 capsule.    [provider]  omeprazole (PRILOSEC OTC) 20 MG tablet Take 20 mg by mouth daily.    [provider]  polyethylene glycol (MIRALAX  / GLYCOLAX ) 17 g packet Take 17 g by mouth daily. 02/13/19   Focht, Wynell Heath, PA    Family History Family History  Family history unknown: Yes    Social History Social History   Tobacco Use   Smoking status: Never   Smokeless tobacco: Never  Vaping Use   Vaping status: Never Used  Substance Use Topics   Alcohol use: Never   Drug use: Never     Allergies   Influenza vaccines, Iodinated contrast media, Iodine, and Influenza vac split quad   Review of Systems Review of Systems  Eyes:  Positive for visual disturbance (hx chronic floaters - unchanged from usual).  Respiratory:  Negative for shortness of breath.   Cardiovascular:  Negative for chest pain, palpitations and leg swelling.  Gastrointestinal:  Negative for nausea and vomiting.  Musculoskeletal:  Negative for back pain, gait problem and neck pain.  Skin:  Positive for wound.  Neurological:  Negative for dizziness, syncope, speech difficulty, weakness, numbness and headaches.  All other systems reviewed and are negative.    Physical Exam Triage Vital Signs ED Triage Vitals  Encounter Vitals Group     BP 02/15/24 1630 (!) 171/83     Systolic BP Percentile --      Diastolic BP Percentile --      Pulse Rate 02/15/24 1630 82     Resp 02/15/24 1630 20     Temp 02/15/24 1630 97.8 F (36.6 C)     Temp Source 02/15/24 1630 Oral     SpO2 02/15/24 1630 97 %     Weight 02/15/24 1627 170  lb (77.1 kg)     Height 02/15/24 1627 5' 1.5" (1.562 m)     Head Circumference --      Peak Flow --      Pain Score 02/15/24 1622 0     Pain Loc --      Pain Education --      Exclude from Growth Chart --    No data found.  Updated Vital Signs BP (!) 171/91 (BP Location: Left Arm)   Pulse 82   Temp 97.8 F (36.6 C) (Oral)   Resp 20   Ht 5' 1.5" (1.562 m)   Wt 170 lb (77.1 kg)   SpO2 97%   BMI 31.60 kg/m   Visual Acuity Right Eye Distance:   Left Eye Distance:  Bilateral Distance:    Right Eye Near:   Left Eye Near:    Bilateral Near:     Physical Exam Constitutional:      General: She is awake. She is not in acute distress.    Appearance: Normal appearance. She is well-developed and well-groomed. She is not ill-appearing, toxic-appearing or diaphoretic.  HENT:     Head: Normocephalic.     Mouth/Throat:     Mouth: Mucous membranes are moist. No injury.  Eyes:     Conjunctiva/sclera: Conjunctivae normal.  Cardiovascular:     Rate and Rhythm: Normal rate and regular rhythm.     Heart sounds: Normal heart sounds.  Pulmonary:     Effort: Pulmonary effort is normal.     Breath sounds: Normal breath sounds.  Musculoskeletal:        General: Normal range of motion.  Skin:    General: Skin is warm and dry.     Findings: Laceration present.     Comments: 3 cm laceration noted to the chin   Neurological:     General: No focal deficit present.     Mental Status: She is alert and oriented to person, place, and time.     Cranial Nerves: No cranial nerve deficit.     Sensory: Sensation is intact. No sensory deficit.     Motor: Motor function is intact. No weakness.     Coordination: Coordination is intact.     Gait: Gait is intact.  Psychiatric:        Behavior: Behavior is cooperative.      UC Treatments / Results  Labs (all labs ordered are listed, but only abnormal results are displayed) Labs Reviewed - No data to display  EKG   Radiology No results  found.  Procedures Laceration Repair  Date/Time: 02/15/2024 5:54 PM  Performed by: Maryruth Sol, FNP Authorized by: Maryruth Sol, FNP   Consent:    Consent obtained:  Verbal   Consent given by:  Patient   Risks, benefits, and alternatives were discussed: yes     Risks discussed:  Infection, pain and poor cosmetic result Universal protocol:    Patient identity confirmed:  Verbally with patient and arm band Laceration details:    Location:  Face   Face location:  Chin   Length (cm):  3 Pre-procedure details:    Preparation:  Patient was prepped and draped in usual sterile fashion Exploration:    Contaminated: no   Treatment:    Area cleansed with:  Chlorhexidine   Amount of cleaning:  Standard   Debridement:  None Skin repair:    Repair method:  Sutures   Suture size:  3-0   Suture material:  Prolene   Suture technique:  Simple interrupted   Number of sutures:  5 Approximation:    Approximation:  Close Repair type:    Repair type:  Simple Post-procedure details:    Dressing:  Non-adherent dressing   Procedure completion:  Tolerated well, no immediate complications  (including critical care time)  Medications Ordered in UC Medications - No data to display  Initial Impression / Assessment and Plan / UC Course  I have reviewed the triage vital signs and the nursing notes.  Pertinent labs & imaging results that were available during my care of the patient were reviewed by me and considered in my medical decision making (see chart for details).    83 year old female presenting with a chin laceration sustained during a mechanical fall. The wound was  repaired without difficulty or immediate complication. Wound care instructions and signs of infection or delayed healing were reviewed with both the patient and her daughter. She was advised to return to the clinic or follow up with her primary care provider in one week for suture removal.  Today's evaluation has  revealed no signs of a dangerous process. Discussed diagnosis with patient and/or guardian. Patient and/or guardian aware of their diagnosis, possible red flag symptoms to watch out for and need for close follow up. Patient and/or guardian understands verbal and written discharge instructions. Patient and/or guardian comfortable with plan and disposition.  Patient and/or guardian has a clear mental status at this time, good insight into illness (after discussion and teaching) and has clear judgment to make decisions regarding their care  Documentation was completed with the aid of voice recognition software. Transcription may contain typographical errors. Final Clinical Impressions(s) / UC Diagnoses   Final diagnoses:  Chin laceration, initial encounter  Fall, initial encounter     Discharge Instructions      You were seen today for a cut (laceration) on your chin. A laceration is a type of injury that can go through all layers of the skin and sometimes into the tissue underneath. Some cuts can heal on their own, but others need to be closed to help them heal properly and reduce the risk of infection.  Your cut was treated with stitches. Keep the dressing on for the next 24 hours and do not let it get wet. After 24 hours, you can remove the dressing. Clean the area gently once a day using warm water and antibacterial soap. After cleaning, pat the area dry with a clean towel. Do not rub the wound, scratch it, or pick at it. Avoid using disinfectants, antiseptics, ointments, creams, or liquid medicines on the wound.  Keep the area covered with a non-stick dressing for the next few days. If you're at home and not doing anything that could get the wound dirty, you can leave it uncovered, as air helps promote healing.  Please return here or follow up with your healthcare provider in one week to have the stitches removed. If you notice increased redness, swelling, pus, or worsening pain, contact your  provider right away.     ED Prescriptions   None    PDMP not reviewed this encounter.   Maryruth Sol, [place] 02/15/24 [phone]

## 2024-02-15 NOTE — ED Triage Notes (Signed)
"  I was walking to my car to get my phone, I tripped because the sidewalk was up just a bit, I landed on my chin (on cement) causing laceration on my chin". Time "1545". "I may have had my teeth together so maybe chipped a bottom front tooth". No loc. No nausea or vomiting. Tdap UTD ?Gloria Whitney

## 2024-02-15 NOTE — ED Notes (Signed)
 Tdap Unavailable.  See Below:

## 2024-02-15 NOTE — ED Notes (Signed)
 Non stick Wound dressing placed over sutures/wound.

## 2024-02-15 NOTE — Discharge Instructions (Addendum)
 You were seen today for a cut (laceration) on your chin. A laceration is a type of injury that can go through all layers of the skin and sometimes into the tissue underneath. Some cuts can heal on their own, but others need to be closed to help them heal properly and reduce the risk of infection.  Your cut was treated with stitches. Keep the dressing on for the next 24 hours and do not let it get wet. After 24 hours, you can remove the dressing. Clean the area gently once a day using warm water and antibacterial soap. After cleaning, pat the area dry with a clean towel. Do not rub the wound, scratch it, or pick at it. Avoid using disinfectants, antiseptics, ointments, creams, or liquid medicines on the wound.  Keep the area covered with a non-stick dressing for the next few days. If you're at home and not doing anything that could get the wound dirty, you can leave it uncovered, as air helps promote healing.  Please return here or follow up with your healthcare provider in one week to have the stitches removed. If you notice increased redness, swelling, pus, or worsening pain, contact your provider right away.

## 2024-02-23 DIAGNOSIS — W19XXXA Unspecified fall, initial encounter: Secondary | ICD-10-CM | POA: Diagnosis not present

## 2024-02-23 DIAGNOSIS — S0181XA Laceration without foreign body of other part of head, initial encounter: Secondary | ICD-10-CM | POA: Diagnosis not present

## 2024-02-26 DIAGNOSIS — R262 Difficulty in walking, not elsewhere classified: Secondary | ICD-10-CM | POA: Diagnosis not present

## 2024-03-01 DIAGNOSIS — R262 Difficulty in walking, not elsewhere classified: Secondary | ICD-10-CM | POA: Diagnosis not present

## 2024-03-03 DIAGNOSIS — R262 Difficulty in walking, not elsewhere classified: Secondary | ICD-10-CM | POA: Diagnosis not present

## 2024-03-08 DIAGNOSIS — R262 Difficulty in walking, not elsewhere classified: Secondary | ICD-10-CM | POA: Diagnosis not present

## 2024-03-11 DIAGNOSIS — R262 Difficulty in walking, not elsewhere classified: Secondary | ICD-10-CM | POA: Diagnosis not present

## 2024-03-15 DIAGNOSIS — R262 Difficulty in walking, not elsewhere classified: Secondary | ICD-10-CM | POA: Diagnosis not present

## 2024-03-18 ENCOUNTER — Encounter: Payer: Self-pay | Admitting: Podiatry

## 2024-03-18 ENCOUNTER — Ambulatory Visit: Admitting: Podiatry

## 2024-03-18 VITALS — Ht 61.5 in | Wt 170.0 lb

## 2024-03-18 DIAGNOSIS — L84 Corns and callosities: Secondary | ICD-10-CM | POA: Diagnosis not present

## 2024-03-18 DIAGNOSIS — M79675 Pain in left toe(s): Secondary | ICD-10-CM

## 2024-03-18 DIAGNOSIS — M79674 Pain in right toe(s): Secondary | ICD-10-CM

## 2024-03-18 DIAGNOSIS — B351 Tinea unguium: Secondary | ICD-10-CM

## 2024-03-18 NOTE — Progress Notes (Signed)
 Subjective:   Patient ID: Gloria Whitney, female   DOB: 83 y.o.   MRN: [id number]   HPI Patient is found to have severe keratotic lesion underneath the first metatarsal of both feet fifth metatarsal painful and nail disease with thickness 1-5 both feet that she cannot take care of   ROS      Objective:  Physical Exam  Neuro vascular status intact significant keratotic lesion formation bilateral painful and nail disease with fungal infiltration 1-5 both feet with pain     Assessment:  H&P P and all conditions reviewed concerning keratotic lesion mycotic nail infection     Plan:  Sharp sterile debridement of lesions bilateral no iatrogenic bleeding and sterile debridement of nailbeds 1-5 both feet no iatrogenic bleeding noted

## 2024-03-22 DIAGNOSIS — R262 Difficulty in walking, not elsewhere classified: Secondary | ICD-10-CM | POA: Diagnosis not present

## 2024-03-24 DIAGNOSIS — R262 Difficulty in walking, not elsewhere classified: Secondary | ICD-10-CM | POA: Diagnosis not present

## 2024-03-27 NOTE — Progress Notes (Unsigned)
  Cardiology Office Note   Date:  03/28/2024  ID:  Gloria Whitney, Gloria Whitney [birth year], MRN [id number] PCP: Merl Star, MD   HeartCare Providers Cardiologist:  None     March 28, 2024  Gloria Whitney is a 83 y.o. female  who we are asked to see for episodes of near syncope  These episodes occur several times a week  Often checks her BP with these episodes and her BP is typically good  Is also short of breath with exertion  Does not exercise regularly  Just walks her sisters dog on occasion   Has been on amlodipine  in the past but he has stopped it  Is not related to her leg edema   Has been short of breath for > 1 year   Occasional episodes of chest discomfort  - not related to exercise      ROS:   Studies Reviewed EKG Interpretation Date/Time:  Monday March 28 2024 14:35:20 EDT Ventricular Rate:  96 PR Interval:  160 QRS Duration:  82 QT Interval:  368 QTC Calculation: 464 R Axis:   2  Text Interpretation: Normal sinus rhythm Possible Anterior infarct , age undetermined When compared with ECG of 07-Dec-2021 12:18, No significant change since last tracing Confirmed by Ahmad Alert ([id number]) on 03/28/2024 2:50:15 PM     Risk Assessment/Calculations           Physical Exam VS:  BP 132/88   Pulse 96   Ht 5' 1.5" (1.562 m)   Wt 165 lb 3.2 oz (74.9 kg)   SpO2 95%   BMI 30.71 kg/m    Wt Readings from Last 3 Encounters:  03/28/24 165 lb 3.2 oz (74.9 kg)  03/18/24 170 lb (77.1 kg)  02/15/24 170 lb (77.1 kg)    GEN: Well nourished, well developed in no acute distress NECK: No JVD; No carotid bruits CARDIAC: RRR, no murmurs, rubs, gallops RESPIRATORY:  Clear to auscultation without rales, wheezing or rhonchi  ABDOMEN: Soft, non-tender, non-distended EXTREMITIES:  No edema; No deformity   ASSESSMENT AND PLAN  Near syncope:  she complains of near syncope for the past year.   Will place a 14 day monitor on her for further evaluation   2.  DOE:  will  get an echo . She may be deconditioned.     No real angina symptoms to suggest coronary artery disease     3.  Leg edema :  is on lasix 20 mg a day .    Continue for now   4.  Atypical chest pain :  not c/w angina .  Will see what her echo looks loke    Will follow up with us  in 3-4 months         Dispo:   Signed, Ahmad Alert, MD

## 2024-03-28 ENCOUNTER — Ambulatory Visit: Attending: Cardiovascular Disease | Admitting: Cardiovascular Disease

## 2024-03-28 ENCOUNTER — Encounter: Payer: Self-pay | Admitting: Cardiovascular Disease

## 2024-03-28 VITALS — BP 132/88 | HR 96 | Ht 61.5 in | Wt 165.2 lb

## 2024-03-28 DIAGNOSIS — R0602 Shortness of breath: Secondary | ICD-10-CM | POA: Diagnosis not present

## 2024-03-28 DIAGNOSIS — R600 Localized edema: Secondary | ICD-10-CM | POA: Diagnosis not present

## 2024-03-28 DIAGNOSIS — R0609 Other forms of dyspnea: Secondary | ICD-10-CM | POA: Diagnosis not present

## 2024-03-28 DIAGNOSIS — R55 Syncope and collapse: Secondary | ICD-10-CM

## 2024-03-28 DIAGNOSIS — E782 Mixed hyperlipidemia: Secondary | ICD-10-CM

## 2024-03-28 NOTE — Patient Instructions (Addendum)
 Testing/Procedures: ECHO Your physician has requested that you have an echocardiogram. Echocardiography is a painless test that uses sound waves to create images of your heart. It provides your doctor with information about the size and shape of your heart and how well your heart's chambers and valves are working. This procedure takes approximately one hour. There are no restrictions for this procedure. Please do NOT wear cologne, perfume, aftershave, or lotions (deodorant is allowed). Please arrive 15 minutes prior to your appointment time.  Please note: We ask at that you not bring children with you during ultrasound (echo/ vascular) testing. Due to room size and safety concerns, children are not allowed in the ultrasound rooms during exams. Our front office staff cannot provide observation of children in our lobby area while testing is being conducted. An adult accompanying a patient to their appointment will only be allowed in the ultrasound room at the discretion of the ultrasound technician under special circumstances. We apologize for any inconvenience.  14-day monitor Your physician has recommended that you wear an event monitor. Event monitors are medical devices that record the heart's electrical activity. Doctors most often us  these monitors to diagnose arrhythmias. Arrhythmias are problems with the speed or rhythm of the heartbeat. The monitor is a small, portable device. You can wear one while you do your normal daily activities. This is usually used to diagnose what is causing palpitations/syncope (passing out).  Follow-Up: At [facility], you and your health needs are our priority.  As part of our continuing mission to provide you with exceptional heart care, our providers are all part of one team.  This team includes your primary Cardiologist (physician) and Advanced Practice Providers or APPs (Physician Assistants and Nurse Practitioners) who all work together to provide you  with the care you need, when you need it.  Your next appointment:   4 month(s)  Provider:   APP   ZIO XT- Long Term Monitor Instructions  Your physician has requested you wear a ZIO patch monitor for 14 days.  This is a single patch monitor. [employer] supplies one patch monitor per enrollment. Additional stickers are not available. Please do not apply patch if you will be having a Nuclear Stress Test,  Echocardiogram, Cardiac CT, MRI, or Chest Xray during the period you would be wearing the  monitor. The patch cannot be worn during these tests. You cannot remove and re-apply the  ZIO XT patch monitor.  Your ZIO patch monitor will be mailed 3 day USPS to your address on file. It may take 3-5 days  to receive your monitor after you have been enrolled.  Once you have received your monitor, please review the enclosed instructions. Your monitor  has already been registered assigning a specific monitor serial # to you.  Billing and Patient Assistance Program Information  We have supplied [employer] with any of your insurance information on file for billing purposes. [employer] offers a sliding scale Patient Assistance Program for patients that do not have  insurance, or whose insurance does not completely cover the cost of the ZIO monitor.  You must apply for the Patient Assistance Program to qualify for this discounted rate.  To apply, please call [employer] at [phone], select option 4, select option 2, ask to apply for  Patient Assistance Program. Sanna Crystal will ask your household income, and how many people  are in your household. They will quote your out-of-pocket cost based on that information.  [employer] will also be able to set up  a 34-month, interest-free payment plan if needed.  Applying the monitor   Shave hair from upper left chest.  Hold abrader disc by orange tab. Rub abrader in 40 strokes over the upper left chest as  indicated in your monitor instructions.  Clean area with 4  enclosed alcohol pads. Let dry.  Apply patch as indicated in monitor instructions. Patch will be placed under collarbone on left  side of chest with arrow pointing upward.  Rub patch adhesive wings for 2 minutes. Remove white label marked "1". Remove the white  label marked "2". Rub patch adhesive wings for 2 additional minutes.  While looking in a mirror, press and release button in center of patch. A small green light will  flash 3-4 times. This will be your only indicator that the monitor has been turned on.  Do not shower for the first 24 hours. You may shower after the first 24 hours.  Press the button if you feel a symptom. You will hear a small click. Record Date, Time and  Symptom in the Patient Logbook.  When you are ready to remove the patch, follow instructions on the last 2 pages of Patient  Logbook. Stick patch monitor onto the last page of Patient Logbook.  Place Patient Logbook in the blue and white box. Use locking tab on box and tape box closed  securely. The blue and white box has prepaid postage on it. Please place it in the mailbox as  soon as possible. Your physician should have your test results approximately 7 days after the  monitor has been mailed back to [facility].  Call [facility] Customer Care at [phone] if you have questions regarding  your ZIO XT patch monitor. Call them immediately if you see an orange light blinking on your  monitor.  If your monitor falls off in less than 4 days, contact our Monitor department at [phone].  If your monitor becomes loose or falls off after 4 days call [employer] at [phone] for  suggestions on securing your monitor

## 2024-03-29 ENCOUNTER — Ambulatory Visit: Attending: Cardiovascular Disease

## 2024-03-29 DIAGNOSIS — R0602 Shortness of breath: Secondary | ICD-10-CM

## 2024-03-29 DIAGNOSIS — R55 Syncope and collapse: Secondary | ICD-10-CM

## 2024-03-29 DIAGNOSIS — R600 Localized edema: Secondary | ICD-10-CM

## 2024-03-29 DIAGNOSIS — E782 Mixed hyperlipidemia: Secondary | ICD-10-CM

## 2024-03-29 DIAGNOSIS — R0609 Other forms of dyspnea: Secondary | ICD-10-CM

## 2024-03-29 NOTE — Progress Notes (Unsigned)
 Enrolled patient for a 14 day Zio XT  monitor to be mailed to patients home

## 2024-03-30 DIAGNOSIS — R262 Difficulty in walking, not elsewhere classified: Secondary | ICD-10-CM | POA: Diagnosis not present

## 2024-04-01 DIAGNOSIS — R262 Difficulty in walking, not elsewhere classified: Secondary | ICD-10-CM | POA: Diagnosis not present

## 2024-04-05 DIAGNOSIS — R262 Difficulty in walking, not elsewhere classified: Secondary | ICD-10-CM | POA: Diagnosis not present

## 2024-04-06 DIAGNOSIS — Z5181 Encounter for therapeutic drug level monitoring: Secondary | ICD-10-CM | POA: Diagnosis not present

## 2024-04-06 DIAGNOSIS — M810 Age-related osteoporosis without current pathological fracture: Secondary | ICD-10-CM | POA: Diagnosis not present

## 2024-04-07 DIAGNOSIS — R262 Difficulty in walking, not elsewhere classified: Secondary | ICD-10-CM | POA: Diagnosis not present

## 2024-04-12 DIAGNOSIS — R262 Difficulty in walking, not elsewhere classified: Secondary | ICD-10-CM | POA: Diagnosis not present

## 2024-04-14 DIAGNOSIS — R262 Difficulty in walking, not elsewhere classified: Secondary | ICD-10-CM | POA: Diagnosis not present

## 2024-04-18 DIAGNOSIS — R262 Difficulty in walking, not elsewhere classified: Secondary | ICD-10-CM | POA: Diagnosis not present

## 2024-04-26 DIAGNOSIS — R262 Difficulty in walking, not elsewhere classified: Secondary | ICD-10-CM | POA: Diagnosis not present

## 2024-04-27 DIAGNOSIS — R0609 Other forms of dyspnea: Secondary | ICD-10-CM | POA: Diagnosis not present

## 2024-04-27 DIAGNOSIS — R0602 Shortness of breath: Secondary | ICD-10-CM | POA: Diagnosis not present

## 2024-04-28 DIAGNOSIS — R55 Syncope and collapse: Secondary | ICD-10-CM

## 2024-04-28 DIAGNOSIS — R600 Localized edema: Secondary | ICD-10-CM

## 2024-04-28 DIAGNOSIS — R0609 Other forms of dyspnea: Secondary | ICD-10-CM

## 2024-04-28 DIAGNOSIS — R0602 Shortness of breath: Secondary | ICD-10-CM | POA: Diagnosis not present

## 2024-05-03 DIAGNOSIS — R262 Difficulty in walking, not elsewhere classified: Secondary | ICD-10-CM | POA: Diagnosis not present

## 2024-05-05 DIAGNOSIS — H35033 Hypertensive retinopathy, bilateral: Secondary | ICD-10-CM | POA: Diagnosis not present

## 2024-05-05 DIAGNOSIS — H40013 Open angle with borderline findings, low risk, bilateral: Secondary | ICD-10-CM | POA: Diagnosis not present

## 2024-05-05 DIAGNOSIS — R262 Difficulty in walking, not elsewhere classified: Secondary | ICD-10-CM | POA: Diagnosis not present

## 2024-05-05 DIAGNOSIS — H524 Presbyopia: Secondary | ICD-10-CM | POA: Diagnosis not present

## 2024-05-05 DIAGNOSIS — H53143 Visual discomfort, bilateral: Secondary | ICD-10-CM | POA: Diagnosis not present

## 2024-05-05 DIAGNOSIS — H04123 Dry eye syndrome of bilateral lacrimal glands: Secondary | ICD-10-CM | POA: Diagnosis not present

## 2024-05-10 DIAGNOSIS — R262 Difficulty in walking, not elsewhere classified: Secondary | ICD-10-CM | POA: Diagnosis not present

## 2024-05-12 ENCOUNTER — Ambulatory Visit (HOSPITAL_COMMUNITY)
Admission: RE | Admit: 2024-05-12 | Discharge: 2024-05-12 | Disposition: A | Discharge status: Home / Self Care | Source: Ambulatory Visit | Attending: Cardiology | Admitting: Cardiology

## 2024-05-12 DIAGNOSIS — R262 Difficulty in walking, not elsewhere classified: Secondary | ICD-10-CM | POA: Diagnosis not present

## 2024-05-12 DIAGNOSIS — R55 Syncope and collapse: Secondary | ICD-10-CM

## 2024-05-12 DIAGNOSIS — E782 Mixed hyperlipidemia: Secondary | ICD-10-CM | POA: Diagnosis not present

## 2024-05-12 DIAGNOSIS — R600 Localized edema: Secondary | ICD-10-CM

## 2024-05-12 DIAGNOSIS — R0602 Shortness of breath: Secondary | ICD-10-CM | POA: Diagnosis not present

## 2024-05-12 DIAGNOSIS — R0609 Other forms of dyspnea: Secondary | ICD-10-CM | POA: Diagnosis not present

## 2024-05-12 LAB — ECHOCARDIOGRAM COMPLETE
Area-P 1/2: 4.6 cm2
P 1/2 time: 458 ms
S' Lateral: 3.05 cm

## 2024-05-17 DIAGNOSIS — R262 Difficulty in walking, not elsewhere classified: Secondary | ICD-10-CM | POA: Diagnosis not present

## 2024-05-19 ENCOUNTER — Ambulatory Visit: Payer: Self-pay | Admitting: Internal Medicine

## 2024-05-20 DIAGNOSIS — R262 Difficulty in walking, not elsewhere classified: Secondary | ICD-10-CM | POA: Diagnosis not present

## 2024-05-23 NOTE — Telephone Encounter (Signed)
Left message to call back for monitor results 

## 2024-05-24 ENCOUNTER — Telehealth: Payer: Self-pay | Admitting: Cardiovascular Disease

## 2024-05-24 DIAGNOSIS — Z5181 Encounter for therapeutic drug level monitoring: Secondary | ICD-10-CM | POA: Diagnosis not present

## 2024-05-24 DIAGNOSIS — Z0000 Encounter for general adult medical examination without abnormal findings: Secondary | ICD-10-CM | POA: Diagnosis not present

## 2024-05-24 DIAGNOSIS — M810 Age-related osteoporosis without current pathological fracture: Secondary | ICD-10-CM | POA: Diagnosis not present

## 2024-05-24 DIAGNOSIS — E7800 Pure hypercholesterolemia, unspecified: Secondary | ICD-10-CM | POA: Diagnosis not present

## 2024-05-24 DIAGNOSIS — I10 Essential (primary) hypertension: Secondary | ICD-10-CM | POA: Diagnosis not present

## 2024-05-24 DIAGNOSIS — R262 Difficulty in walking, not elsewhere classified: Secondary | ICD-10-CM | POA: Diagnosis not present

## 2024-05-24 NOTE — Telephone Encounter (Signed)
 Pt is requesting a callback regarding results. Please advise

## 2024-05-24 NOTE — Telephone Encounter (Signed)
 Returned patient call to follow up on results. Results given to patient per Dr. Mona monitor showed 2 short runs of  fast heart beat (NSVT) and a few runs of PSVT. Made patient aware per Dr. Mona no high risk findings. Made patient aware that someone will her a call once results of Echo are available.

## 2024-07-24 NOTE — Progress Notes (Unsigned)
 Cardiology Office Note    Date:  07/26/2024  ID:  Gloria, Whitney 10/15/1941, MRN [id number] PCP:  Rexanne Ingle, MD  Cardiologist:  None  Electrophysiologist:  None   Chief Complaint: Follow up for pre syncope   History of Present Illness: .    Gloria Whitney is a 83 y.o. female with visit-pertinent history of hypertension, hyperlipidemia, and presyncope.  Patient was first evaluated by Dr. Alveta on 03/28/2024 for episodes of near syncope.  Patient reported that she had been having episodes that occurred several times a week, would often check her blood pressure these episodes and her BP was typically good.  Patient also endorsed shortness of breath with exertion however was not regularly exercising.  She noted that she would walk her sisters dog on occasion.  Per notes patient was previously on amlodipine  but this was previously discontinued was not felt to be related to lower extremity edema.  14-day ZIO monitor and echocardiogram were ordered.  Cardiac monitor worn for 13 days and 23 hours indicated an average heart rate of 86 bpm ranging from 59 to 197 bpm.  Predominant underlying rhythm was sinus rhythm.  She had 2 episodes of ventricular tachycardia, run with a fast interval lasting 7 beats with a max rate of 176 bpm, fastest episode was also longest.  She had 56 episodes of supraventricular tachycardia, run with a fast interval lasting 9 beats with a max rate of 197 bpm, longest lasting 15 beats with an average rate of 115 bpm.  Per notes symptoms did not correlate with arrhythmia.  Echocardiogram on 05/12/2024 indicated LVEF 55 to 60%, no RWMA, G1 DD, RV systolic function and size was normal, normal PASP, no evidence of mitral regurgitation, no evidence of stenosis, aortic valve regurgitation was trivial with no stenosis noted.  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  Today she presents for follow-up.  She reports that she continues to have occasional  episodes of presyncope, reports that she is unable to say how often these occur she notes it is very erratic in their timing.  She just notes that she has an overall feeling of lightheadedness that can persist from a few minutes to a few hours, she has not checked her blood pressure during these episodes.  She denies any significant palpitations, notes some on occasion.  She endorses some occasional chest discomfort that can be in the center of her chest or in her left shoulder, was told previously that this was likely musculoskeletal or GI in nature however her symptoms do persist, not specifically associated with exertion.  She also notes increased dyspnea with exertion, notes that she will walk her sisters dog a short distance and will become significantly short of breath.  Labwork independently reviewed: 05/24/2024: Hemoglobin 11.1, hematocrit 33.1, creatinine 0.76, sodium 141, potassium 4.3, AST 21, ALT 15, TSH 2.13 ROS: .   Today she denies fatigue, melena, hematuria, hemoptysis, diaphoresis, weakness, syncope, orthopnea, and PND.  All other systems are reviewed and otherwise negative. Studies Reviewed: SABRA   EKG:  EKG is not ordered today.  CV Studies: Cardiac studies reviewed are outlined and summarized above. Otherwise please see EMR for full report. Cardiac Studies & Procedures   ______________________________________________________________________________________________     ECHOCARDIOGRAM  ECHOCARDIOGRAM COMPLETE 05/12/2024  Narrative ECHOCARDIOGRAM REPORT    Patient Name:   Gloria Whitney Date of Exam: 05/12/2024 Medical Rec #:  [id number]          Height:  61.5 in Accession #:    [id number]         Weight:       165.2 lb Date of Birth:  1941-02-12          BSA:          1.752 m Patient Age:    83 years           BP:           132/88 mmHg Patient Gender: F                  HR:           85 bpm. Exam Location:  [address]  Procedure: 2D Echo, 3D Echo, Cardiac  Doppler and Color Doppler (Both Spectral and Color Flow Doppler were utilized during procedure).  Indications:    R06.02 Shortness of Breath  History:        Patient has no prior history of Echocardiogram examinations. Signs/Symptoms:Shortness of Breath, Syncope, Dizziness/Lightheadedness and Edema; Risk Factors:Hypertension and Dyslipidemia.  Sonographer:    Heather Hawks RDCS Referring Phys: RONALD POLITE  IMPRESSIONS   1. Left ventricular ejection fraction, by estimation, is 55 to 60%. Left ventricular ejection fraction by 3D volume is 72 %. The left ventricle has normal function. The left ventricle has no regional wall motion abnormalities. Left ventricular diastolic parameters are consistent with Grade I diastolic dysfunction (impaired relaxation). 2. Right ventricular systolic function is normal. The right ventricular size is normal. There is normal pulmonary artery systolic pressure. The estimated right ventricular systolic pressure is 35.8 mmHg. 3. The mitral valve is normal in structure. No evidence of mitral valve regurgitation. No evidence of mitral stenosis. 4. The aortic valve is normal in structure. Aortic valve regurgitation is trivial. No aortic stenosis is present. 5. Aortic dilatation noted. There is dilatation of the ascending aorta, measuring 39 mm. 6. The inferior vena cava is dilated in size with <50% respiratory variability, suggesting right atrial pressure of 15 mmHg.  FINDINGS Left Ventricle: Left ventricular ejection fraction, by estimation, is 55 to 60%. Left ventricular ejection fraction by 3D volume is 72 %. The left ventricle has normal function. The left ventricle has no regional wall motion abnormalities. The left ventricular internal cavity size was normal in size. There is no left ventricular hypertrophy. Left ventricular diastolic parameters are consistent with Grade I diastolic dysfunction (impaired relaxation).  Right Ventricle: The right  ventricular size is normal. No increase in right ventricular wall thickness. Right ventricular systolic function is normal. There is normal pulmonary artery systolic pressure. The tricuspid regurgitant velocity is 2.28 m/s, and with an assumed right atrial pressure of 15 mmHg, the estimated right ventricular systolic pressure is 35.8 mmHg.  Left Atrium: Left atrial size was normal in size.  Right Atrium: Right atrial size was normal in size.  Pericardium: There is no evidence of pericardial effusion.  Mitral Valve: The mitral valve is normal in structure. No evidence of mitral valve regurgitation. No evidence of mitral valve stenosis.  Tricuspid Valve: The tricuspid valve is normal in structure. Tricuspid valve regurgitation is mild . No evidence of tricuspid stenosis.  Aortic Valve: The aortic valve is normal in structure. Aortic valve regurgitation is trivial. Aortic regurgitation PHT measures 458 msec. No aortic stenosis is present.  Pulmonic Valve: The pulmonic valve was normal in structure. Pulmonic valve regurgitation is not visualized. No evidence of pulmonic stenosis.  Aorta: Aortic dilatation noted. There is dilatation of the ascending aorta, measuring 39  mm.  Venous: The inferior vena cava is dilated in size with less than 50% respiratory variability, suggesting right atrial pressure of 15 mmHg.  IAS/Shunts: No atrial level shunt detected by color flow Doppler.  Additional Comments: 3D was performed not requiring image post processing on an independent workstation and was normal.   LEFT VENTRICLE PLAX 2D LVIDd:         4.55 cm         Diastology LVIDs:         3.05 cm         LV e' medial:    6.42 cm/s LV PW:         1.00 cm         LV E/e' medial:  9.0 LV IVS:        0.90 cm         LV e' lateral:   8.95 cm/s LVOT diam:     2.40 cm         LV E/e' lateral: 6.5 LV SV:         66 LV SV Index:   38 LVOT Area:     4.52 cm        3D Volume EF LV 3D EF:     Left ventricul ar ejection fraction by 3D volume is 72 %.  3D Volume EF: 3D EF:        72 % LV EDV:       126 ml LV ESV:       36 ml LV SV:        91 ml  RIGHT VENTRICLE RV Basal diam:  3.00 cm RV S prime:     15.90 cm/s TAPSE (M-mode): 1.9 cm RVSP:           23.8 mmHg  LEFT ATRIUM             Index        RIGHT ATRIUM           Index LA diam:        3.90 cm 2.23 cm/m   RA Pressure: 3.00 mmHg LA Vol (A2C):   52.4 ml 29.91 ml/m  RA Area:     15.70 cm LA Vol (A4C):   51.3 ml 29.28 ml/m  RA Volume:   38.90 ml  22.20 ml/m LA Biplane Vol: 52.5 ml 29.97 ml/m AORTIC VALVE LVOT Vmax:   73.55 cm/s LVOT Vmean:  49.300 cm/s LVOT VTI:    0.146 m AI PHT:      458 msec  AORTA Ao Root diam: 3.20 cm Ao Asc diam:  4.00 cm  MITRAL VALVE                TRICUSPID VALVE MV Area (PHT): cm          TR Peak grad:   20.8 mmHg MV Decel Time: 165 msec     TR Vmax:        228.00 cm/s MV E velocity: 58.10 cm/s   Estimated RAP:  3.00 mmHg MV A velocity: 111.00 cm/s  RVSP:           23.8 mmHg MV E/A ratio:  0.52 SHUNTS Systemic VTI:  0.15 m Systemic Diam: 2.40 cm  Aditya Sabharwal Electronically signed by Ria Commander Signature Date/Time: 05/12/2024/4:42:23 PM    Final    MONITORS  LONG TERM MONITOR (3-14 DAYS) 04/28/2024  Narrative   Normal sinus rhythm   2 runs of NSVT longest 7  beats   Few runs of SVT longest 15 beats   Otherwise rare PACs and PVCs.   Symptoms do not correlate with arrhythmia   Patch Wear Time:  13 days and 23 hours (2025-06-06T17:38:10-398 to 2025-06-20T17:27:05-399)  Patient had a min HR of 59 bpm, max HR of 197 bpm, and avg HR of 86 bpm. Predominant underlying rhythm was Sinus Rhythm. 2 Ventricular Tachycardia runs occurred, the run with the fastest interval lasting 7 beats with a max rate of 176 bpm (avg 165 bpm); the run with the fastest interval was also the longest. 56 Supraventricular Tachycardia runs occurred, the run with the fastest  interval lasting 9 beats with a max rate of 197 bpm, the longest lasting 15 beats with an avg rate of 115 bpm. Isolated SVEs were rare (<1.0%), SVE Couplets were rare (<1.0%), and SVE Triplets were rare (<1.0%). Isolated VEs were rare (<1.0%, 5820), VE Couplets were rare (<1.0%, 29), and VE Triplets were rare (<1.0%, 1). Ventricular Bigeminy was present.       ______________________________________________________________________________________________       Current Reported Medications:.    Current Meds  Medication Sig   acetaminophen  (TYLENOL ) 500 MG tablet Take 1 tablet (500 mg total) by mouth every 6 (six) hours as needed.   ascorbic acid (VITAMIN C) 1000 MG tablet Take 1,000 mg by mouth daily.   Cholecalciferol (VITAMIN D-3) 25 MCG (1000 UT) CAPS Take 1,000 Units by mouth daily.   CRESTOR 5 MG tablet Take 5 mg by mouth daily.   cyanocobalamin 100 MCG tablet Take 100 mcg by mouth daily.   diclofenac  Sodium (VOLTAREN ) 1 % GEL Apply 4 g topically 4 (four) times daily. (Patient taking differently: Apply 4 g topically daily as needed (for pain).)   Ferrous Sulfate (IRON) 325 (65 Fe) MG TABS 1 tablet Orally Once a day As needed   furosemide (LASIX) 20 MG tablet Take 20 mg by mouth daily.   Magnesium 250 MG TABS Take 250 mg by mouth daily.   metoprolol tartrate (LOPRESSOR) 25 MG tablet Take 0.5 tablets (12.5 mg total) by mouth 2 (two) times daily.   Multiple Vitamin (MULTIVITAMIN ADULT PO) Take 1 tablet by mouth daily at 6 (six) AM.   naproxen sodium (ALEVE) 220 MG tablet Take 220 mg by mouth 2 (two) times daily as needed.   Omega-3 1000 MG CAPS Take by mouth.   omeprazole (PRILOSEC OTC) 20 MG tablet Take 20 mg by mouth daily.   Physical Exam:    VS:  BP 128/78   Pulse 95   Ht 5' 2 (1.575 m)   Wt 168 lb 1.6 oz (76.2 kg)   SpO2 98%   BMI 30.75 kg/m    Wt Readings from Last 3 Encounters:  07/26/24 168 lb 1.6 oz (76.2 kg)  03/28/24 165 lb 3.2 oz (74.9 kg)  03/18/24 170 lb (77.1  kg)    GEN: Well nourished, well developed in no acute distress NECK: No JVD; faint bilateral carotid bruits CARDIAC: RRR, no murmurs, rubs, gallops RESPIRATORY:  Clear to auscultation without rales, wheezing or rhonchi  ABDOMEN: Soft, non-tender, non-distended EXTREMITIES: Mild bilateral lower extremity edema; No acute deformity     Asessement and Plan:.    Chest pain/dyspnea on exertion: Patient reports ongoing dyspnea on exertion, notes that she will walk her sisters dog for short distance and have significant shortness of breath.  She also reports episodes of chest discomfort as well as left shoulder discomfort that can occur at rest and  with exertion.  Patient's echocardiogram reassuring as noted above.  She is agreeable to further ischemic evaluation, further assessed with nuclear stress test with Lexiscan.  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  Check nuclear stress test with Lexiscan.  Please see consent below.  Reviewed ED precautions. Informed Consent   Shared Decision Making/Informed Consent The risks [chest pain, shortness of breath, cardiac arrhythmias, dizziness, blood pressure fluctuations, myocardial infarction, stroke/transient ischemic attack, nausea, vomiting, allergic reaction, radiation exposure, metallic taste sensation and life-threatening complications (estimated to be 1 in 10,000)], benefits (risk stratification, diagnosing coronary artery disease, treatment guidance) and alternatives of a nuclear stress test were discussed in detail with Ms. Heavrin and she agrees to proceed.      Near syncope/carotid bruit: Patient with history of near syncope/lightheadedness, cardiac monitor and echocardiogram reassuring as noted above.  She continues to note occasional episodes of feeling lightheaded, notes this can last for a few minutes to a few hours, she has not recently checked her blood pressure during these episodes.   Encouraged patient to monitor her blood pressure and stay well-hydrated.  Also encouraged use of compression stockings as she does note some increased lower extremity edema.  Check carotid Dopplers, noted to have a slight bilateral carotid bruit.  Reviewed ED precautions.  Palpitations: Cardiac monitor worn for 13 days and 23 hours indicated an average heart rate of 86 bpm ranging from 59 to 197 bpm.  Predominant underlying rhythm was sinus rhythm.  She had 2 episodes of ventricular tachycardia, run with a fast interval lasting 7 beats with a max rate of 176 bpm, fastest episode was also longest.  She had 56 episodes of supraventricular tachycardia, run with a fast interval lasting 9 beats with a max rate of 197 bpm, longest lasting 15 beats with an average rate of 115 bpm.  Patient notes occasional palpitations however is unaware of consistent palpitations.  Will start her on metoprolol tartrate 12.5 mg twice daily.  She will continue to monitor.  Hyperlipidemia: Last lipid profile on 05/24/2024 indicated total cholesterol 126, HDL 51, triglyceride 71 and LDL 60.  Continue Crestor 5 mg daily.  Aortic atherosclerosis: CT angio chest aorta in 05/2023 indicated evidence of aortic atherosclerosis.  Continue Crestor 5 mg daily.   Disposition: F/u with Mukund Weinreb, NP in 8 weeks.   Signed, Bayley Hurn D Jasline Buskirk, NP

## 2024-07-26 ENCOUNTER — Ambulatory Visit: Attending: Cardiology | Admitting: Cardiology

## 2024-07-26 ENCOUNTER — Encounter: Payer: Self-pay | Admitting: Cardiology

## 2024-07-26 VITALS — BP 128/78 | HR 95 | Ht 62.0 in | Wt 168.1 lb

## 2024-07-26 DIAGNOSIS — R55 Syncope and collapse: Secondary | ICD-10-CM | POA: Diagnosis not present

## 2024-07-26 DIAGNOSIS — R0989 Other specified symptoms and signs involving the circulatory and respiratory systems: Secondary | ICD-10-CM

## 2024-07-26 DIAGNOSIS — R0602 Shortness of breath: Secondary | ICD-10-CM | POA: Diagnosis not present

## 2024-07-26 DIAGNOSIS — I700 Atherosclerosis of aorta: Secondary | ICD-10-CM

## 2024-07-26 DIAGNOSIS — E782 Mixed hyperlipidemia: Secondary | ICD-10-CM

## 2024-07-26 DIAGNOSIS — R079 Chest pain, unspecified: Secondary | ICD-10-CM

## 2024-07-26 MED ORDER — METOPROLOL TARTRATE 25 MG PO TABS: 12.5000 mg | ORAL_TABLET | Freq: Two times a day (BID) | ORAL | 3 refills | Status: AC

## 2024-07-26 NOTE — Patient Instructions (Signed)
 Medication Instructions:  Your physician has recommended you make the following change in your medication:   START Metoprolol 25 mg taking 1/2 tablet twice a day  *If you need a refill on your cardiac medications before your next appointment, please call your pharmacy*  Lab Work:  If you have labs (blood work) drawn today and your tests are completely normal, you will receive your results only by: MyChart Message (if you have MyChart) OR A paper copy in the mail If you have any lab test that is abnormal or we need to change your treatment, we will call you to review the results.  Testing/Procedures: Your physician has requested that you have a carotid duplex. This test is an ultrasound of the carotid arteries in your neck. It looks at blood flow through these arteries that supply the brain with blood. Allow one hour for this exam. There are no restrictions or special instructions.  Your physician has requested that you have a lexiscan myoview. For further information please visit [email or web address] Please follow instruction sheet, BELOW:    You are scheduled for a Myocardial Perfusion Imaging Study Please arrive 15 minutes prior to your appointment time for registration and insurance purposes.  The test will take approximately 3 to 4 hours to complete; you may bring reading material.  If someone comes with you to your appointment, they will need to remain in the main lobby due to limited space in the testing area. **If you are pregnant or breastfeeding, please notify the nuclear lab prior to your appointment**  How to prepare for your Myocardial Perfusion Test: Do not eat or drink 3 hours prior to your test, except you may have water. Do not consume products containing caffeine (regular or decaffeinated) 12 hours prior to your test. (ex: coffee, chocolate, sodas, tea). Do bring a list of your current medications with you.  If not listed below, you may take your medications as  normal. Do not take metoprolol (Lopressor, Toprol) for 24 hours prior to the test.  Bring the medication to your appointment as you may be required to take it once the test is complete. Do wear comfortable clothes (no dresses or overalls) and walking shoes, tennis shoes preferred (No heels or open toe shoes are allowed). Do NOT wear cologne, perfume, aftershave, or lotions (deodorant is allowed). If these instructions are not followed, your test will have to be rescheduled.    Follow-Up: At [facility], you and your health needs are our priority.  As part of our continuing mission to provide you with exceptional heart care, our providers are all part of one team.  This team includes your primary Cardiologist (physician) and Advanced Practice Providers or APPs (Physician Assistants and Nurse Practitioners) who all work together to provide you with the care you need, when you need it.  Your next appointment:   6-88 week(s)  Provider:   Katlyn West, NP          We recommend signing up for the patient portal called MyChart.  Sign up information is provided on this After Visit Summary.  MyChart is used to connect with patients for Virtual Visits (Telemedicine).  Patients are able to view lab/test results, encounter notes, upcoming appointments, etc.  Non-urgent messages can be sent to your provider as well.   To learn more about what you can do with MyChart, go to [email or web address].   Other Instructions

## 2024-07-28 ENCOUNTER — Encounter (HOSPITAL_COMMUNITY): Payer: Self-pay | Admitting: *Deleted

## 2024-08-05 ENCOUNTER — Other Ambulatory Visit: Payer: Self-pay | Admitting: Cardiology

## 2024-08-05 DIAGNOSIS — R55 Syncope and collapse: Secondary | ICD-10-CM

## 2024-08-05 DIAGNOSIS — R0602 Shortness of breath: Secondary | ICD-10-CM

## 2024-08-05 DIAGNOSIS — R079 Chest pain, unspecified: Secondary | ICD-10-CM

## 2024-08-08 ENCOUNTER — Ambulatory Visit (HOSPITAL_COMMUNITY)
Admission: RE | Admit: 2024-08-08 | Discharge: 2024-08-08 | Disposition: A | Discharge status: Home / Self Care | Source: Ambulatory Visit | Attending: Cardiovascular Disease | Admitting: Cardiovascular Disease

## 2024-08-08 DIAGNOSIS — R079 Chest pain, unspecified: Secondary | ICD-10-CM | POA: Insufficient documentation

## 2024-08-08 DIAGNOSIS — R55 Syncope and collapse: Secondary | ICD-10-CM | POA: Insufficient documentation

## 2024-08-08 DIAGNOSIS — R0602 Shortness of breath: Secondary | ICD-10-CM | POA: Diagnosis not present

## 2024-08-08 LAB — MYOCARDIAL PERFUSION IMAGING
LV dias vol: 90 mL (ref 46–106)
LV sys vol: 30 mL (ref 3.8–5.2)
Nuc Stress EF: 67 %
Peak HR: 96 {beats}/min
Rest HR: 73 {beats}/min
Rest Nuclear Isotope Dose: 9.7 mCi
SDS: 0
SRS: 9
SSS: 7
ST Depression (mm): 0 mm
Stress Nuclear Isotope Dose: 31.4 mCi
TID: 1.03

## 2024-08-08 MED ORDER — REGADENOSON 0.4 MG/5ML IV SOLN
0.4000 mg | Freq: Once | INTRAVENOUS | Status: AC
  Administered 2024-08-08: 0.4 mg via INTRAVENOUS

## 2024-08-08 MED ORDER — TECHNETIUM TC 99M TETROFOSMIN IV KIT
31.4000 | PACK | Freq: Once | INTRAVENOUS | Status: AC | PRN
Start: 2024-08-08 — End: 2024-08-08
  Administered 2024-08-08: 31.4 via INTRAVENOUS

## 2024-08-08 MED ORDER — REGADENOSON 0.4 MG/5ML IV SOLN
INTRAVENOUS | Status: AC
  Filled 2024-08-08: qty 5

## 2024-08-08 MED ORDER — TECHNETIUM TC 99M TETROFOSMIN IV KIT
9.7000 | PACK | Freq: Once | INTRAVENOUS | Status: AC | PRN
  Administered 2024-08-08: 9.7 via INTRAVENOUS

## 2024-08-09 ENCOUNTER — Ambulatory Visit: Payer: Self-pay | Admitting: Cardiology

## 2024-08-09 ENCOUNTER — Ambulatory Visit (HOSPITAL_COMMUNITY)
Admission: RE | Admit: 2024-08-09 | Discharge: 2024-08-09 | Disposition: A | Discharge status: Home / Self Care | Source: Ambulatory Visit | Attending: Cardiology | Admitting: Cardiology

## 2024-08-09 DIAGNOSIS — R079 Chest pain, unspecified: Secondary | ICD-10-CM | POA: Diagnosis not present

## 2024-08-09 DIAGNOSIS — R55 Syncope and collapse: Secondary | ICD-10-CM | POA: Diagnosis not present

## 2024-08-09 DIAGNOSIS — R0602 Shortness of breath: Secondary | ICD-10-CM | POA: Diagnosis not present

## 2024-08-10 ENCOUNTER — Ambulatory Visit: Payer: Self-pay | Admitting: Cardiology

## 2024-09-05 NOTE — Progress Notes (Unsigned)
 Cardiology Office Note    Date:  09/06/2024  ID:  Witney, Huie Jun 25, 1941, MRN [id number] PCP:  Rexanne Ingle, MD  Cardiologist:  Georganna Archer, MD  Electrophysiologist:  None   Chief Complaint: Follow up for presyncope   History of Present Illness: .    Gloria Whitney is a 83 y.o. female with visit-pertinent history of hypertension, hyperlipidemia, and presyncope.   Patient was first evaluated by Dr. Alveta on 03/28/2024 for episodes of near syncope.  Patient reported that she had been having episodes that occurred several times a week, would often check her blood pressure these episodes and her BP was typically good.  Patient also endorsed shortness of breath with exertion however was not regularly exercising.  She noted that she would walk her sisters dog on occasion.  Per notes patient was previously on amlodipine  but this was previously discontinued was not felt to be related to lower extremity edema.  14-day ZIO monitor and echocardiogram were ordered.   Cardiac monitor worn for 13 days and 23 hours indicated an average heart rate of 86 bpm ranging from 59 to 197 bpm.  Predominant underlying rhythm was sinus rhythm.  She had 2 episodes of ventricular tachycardia, run with a fast interval lasting 7 beats with a max rate of 176 bpm, fastest episode was also longest.  She had 56 episodes of supraventricular tachycardia, run with a fast interval lasting 9 beats with a max rate of 197 bpm, longest lasting 15 beats with an average rate of 115 bpm.  Per notes symptoms did not correlate with arrhythmia.   Echocardiogram on 05/12/2024 indicated LVEF 55 to 60%, no RWMA, G1 DD, RV systolic function and size was normal, normal PASP, no evidence of mitral regurgitation, no evidence of stenosis, aortic valve regurgitation was trivial with no stenosis noted.  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  Patient was seen in clinic on 07/26/2024 for follow-up.  Patient  reported she continued to have occasional episodes of presyncope, reported that she was unable to say how often these were occurring, noted very erratic in timing.  She endorsed an occasional chest discomfort in the center of her chest or in her left shoulder.  MPI ordered for further evaluation as well as carotid Dopplers given recurrent presyncope.  Patient was also started on metoprolol tartrate 12.5 mg twice daily for recurrent palpitations.  Nuclear stress test on 08/08/2024 was normal and low risk, mild coronary calcifications were present on attenuation correction CT imaging.  Carotid duplex on 08/09/2024 indicated bilateral extracranial vessels were near normal with only minimal wall thickening or plaque.  Today she presents for follow-up.  She reports that she has been doing well. She reports that her palpitations have improved. She denies chest pain, continues to note some dyspnea on exertion such as when walking her dogs, notes some possible improvement with dose of metoprolol.  Reports that her palpitations and episodes of presyncope have improved.  She denies any episodes of syncope.  She reports that she has some intermittent lower extremity swelling, reports typically is overall well-controlled.  Reviewed patient's recent studies, all questions answered. ROS: .   Today she denies chest pain, fatigue, palpitations, melena, hematuria, hemoptysis, diaphoresis, weakness, presyncope, syncope, orthopnea, and PND.  All other systems are reviewed and otherwise negative. Studies Reviewed: SABRA   EKG:  EKG is not ordered today.  CV Studies: Cardiac studies reviewed are outlined and summarized above. Otherwise please see EMR for full  report. Cardiac Studies & Procedures   ______________________________________________________________________________________________   STRESS TESTS  MYOCARDIAL PERFUSION IMAGING 08/08/2024  Interpretation Summary   The study is normal. The study is low risk.   No  ST deviation was noted.   LV perfusion is normal.   Left ventricular function is normal. Nuclear stress EF: 67%. The left ventricular ejection fraction is hyperdynamic (>65%). End diastolic cavity size is normal. End systolic cavity size is normal.   CT images were obtained for attenuation correction and were examined for the presence of coronary calcium when appropriate.   Coronary calcium was present on the attenuation correction CT images. Mild coronary calcifications were present. Coronary calcifications were present in the left circumflex artery distribution(s).   Electronically signed by Jerel Balding, MD   ECHOCARDIOGRAM  ECHOCARDIOGRAM COMPLETE 05/12/2024  Narrative ECHOCARDIOGRAM REPORT    Patient Name:   Gloria Whitney Date of Exam: 05/12/2024 Medical Rec #:  [id number]          Height:       61.5 in Accession #:    [id number]         Weight:       165.2 lb Date of Birth:  [birth year]          BSA:          1.752 m Patient Age:    83 years           BP:           132/88 mmHg Patient Gender: F                  HR:           85 bpm. Exam Location:  [address]  Procedure: 2D Echo, 3D Echo, Cardiac Doppler and Color Doppler (Both Spectral and Color Flow Doppler were utilized during procedure).  Indications:    R06.02 Shortness of Breath  History:        Patient has no prior history of Echocardiogram examinations. Signs/Symptoms:Shortness of Breath, Syncope, Dizziness/Lightheadedness and Edema; Risk Factors:Hypertension and Dyslipidemia.  Sonographer:    Heather Hawks RDCS Referring Phys: RONALD POLITE  IMPRESSIONS   1. Left ventricular ejection fraction, by estimation, is 55 to 60%. Left ventricular ejection fraction by 3D volume is 72 %. The left ventricle has normal function. The left ventricle has no regional wall motion abnormalities. Left ventricular diastolic parameters are consistent with Grade I diastolic dysfunction (impaired relaxation). 2. Right  ventricular systolic function is normal. The right ventricular size is normal. There is normal pulmonary artery systolic pressure. The estimated right ventricular systolic pressure is 35.8 mmHg. 3. The mitral valve is normal in structure. No evidence of mitral valve regurgitation. No evidence of mitral stenosis. 4. The aortic valve is normal in structure. Aortic valve regurgitation is trivial. No aortic stenosis is present. 5. Aortic dilatation noted. There is dilatation of the ascending aorta, measuring 39 mm. 6. The inferior vena cava is dilated in size with <50% respiratory variability, suggesting right atrial pressure of 15 mmHg.  FINDINGS Left Ventricle: Left ventricular ejection fraction, by estimation, is 55 to 60%. Left ventricular ejection fraction by 3D volume is 72 %. The left ventricle has normal function. The left ventricle has no regional wall motion abnormalities. The left ventricular internal cavity size was normal in size. There is no left ventricular hypertrophy. Left ventricular diastolic parameters are consistent with Grade I diastolic dysfunction (impaired relaxation).  Right Ventricle: The right ventricular size is normal. No increase in  right ventricular wall thickness. Right ventricular systolic function is normal. There is normal pulmonary artery systolic pressure. The tricuspid regurgitant velocity is 2.28 m/s, and with an assumed right atrial pressure of 15 mmHg, the estimated right ventricular systolic pressure is 35.8 mmHg.  Left Atrium: Left atrial size was normal in size.  Right Atrium: Right atrial size was normal in size.  Pericardium: There is no evidence of pericardial effusion.  Mitral Valve: The mitral valve is normal in structure. No evidence of mitral valve regurgitation. No evidence of mitral valve stenosis.  Tricuspid Valve: The tricuspid valve is normal in structure. Tricuspid valve regurgitation is mild . No evidence of tricuspid stenosis.  Aortic  Valve: The aortic valve is normal in structure. Aortic valve regurgitation is trivial. Aortic regurgitation PHT measures 458 msec. No aortic stenosis is present.  Pulmonic Valve: The pulmonic valve was normal in structure. Pulmonic valve regurgitation is not visualized. No evidence of pulmonic stenosis.  Aorta: Aortic dilatation noted. There is dilatation of the ascending aorta, measuring 39 mm.  Venous: The inferior vena cava is dilated in size with less than 50% respiratory variability, suggesting right atrial pressure of 15 mmHg.  IAS/Shunts: No atrial level shunt detected by color flow Doppler.  Additional Comments: 3D was performed not requiring image post processing on an independent workstation and was normal.   LEFT VENTRICLE PLAX 2D LVIDd:         4.55 cm         Diastology LVIDs:         3.05 cm         LV e' medial:    6.42 cm/s LV PW:         1.00 cm         LV E/e' medial:  9.0 LV IVS:        0.90 cm         LV e' lateral:   8.95 cm/s LVOT diam:     2.40 cm         LV E/e' lateral: 6.5 LV SV:         66 LV SV Index:   38 LVOT Area:     4.52 cm        3D Volume EF LV 3D EF:    Left ventricul ar ejection fraction by 3D volume is 72 %.  3D Volume EF: 3D EF:        72 % LV EDV:       126 ml LV ESV:       36 ml LV SV:        91 ml  RIGHT VENTRICLE RV Basal diam:  3.00 cm RV S prime:     15.90 cm/s TAPSE (M-mode): 1.9 cm RVSP:           23.8 mmHg  LEFT ATRIUM             Index        RIGHT ATRIUM           Index LA diam:        3.90 cm 2.23 cm/m   RA Pressure: 3.00 mmHg LA Vol (A2C):   52.4 ml 29.91 ml/m  RA Area:     15.70 cm LA Vol (A4C):   51.3 ml 29.28 ml/m  RA Volume:   38.90 ml  22.20 ml/m LA Biplane Vol: 52.5 ml 29.97 ml/m AORTIC VALVE LVOT Vmax:   73.55 cm/s LVOT Vmean:  49.300 cm/s LVOT  VTI:    0.146 m AI PHT:      458 msec  AORTA Ao Root diam: 3.20 cm Ao Asc diam:  4.00 cm  MITRAL VALVE                TRICUSPID VALVE MV Area (PHT):  cm          TR Peak grad:   20.8 mmHg MV Decel Time: 165 msec     TR Vmax:        228.00 cm/s MV E velocity: 58.10 cm/s   Estimated RAP:  3.00 mmHg MV A velocity: 111.00 cm/s  RVSP:           23.8 mmHg MV E/A ratio:  0.52 SHUNTS Systemic VTI:  0.15 m Systemic Diam: 2.40 cm  Aditya Sabharwal Electronically signed by Ria Commander Signature Date/Time: 05/12/2024/4:42:23 PM    Final    MONITORS  LONG TERM MONITOR (3-14 DAYS) 04/28/2024  Narrative   Normal sinus rhythm   2 runs of NSVT longest 7 beats   Few runs of SVT longest 15 beats   Otherwise rare PACs and PVCs.   Symptoms do not correlate with arrhythmia   Patch Wear Time:  13 days and 23 hours (2025-06-06T17:38:10-398 to 2025-06-20T17:27:05-399)  Patient had a min HR of 59 bpm, max HR of 197 bpm, and avg HR of 86 bpm. Predominant underlying rhythm was Sinus Rhythm. 2 Ventricular Tachycardia runs occurred, the run with the fastest interval lasting 7 beats with a max rate of 176 bpm (avg 165 bpm); the run with the fastest interval was also the longest. 56 Supraventricular Tachycardia runs occurred, the run with the fastest interval lasting 9 beats with a max rate of 197 bpm, the longest lasting 15 beats with an avg rate of 115 bpm. Isolated SVEs were rare (<1.0%), SVE Couplets were rare (<1.0%), and SVE Triplets were rare (<1.0%). Isolated VEs were rare (<1.0%, 5820), VE Couplets were rare (<1.0%, 29), and VE Triplets were rare (<1.0%, 1). Ventricular Bigeminy was present.       ______________________________________________________________________________________________       Current Reported Medications:.    Current Meds  Medication Sig   acetaminophen  (TYLENOL ) 500 MG tablet Take 1 tablet (500 mg total) by mouth every 6 (six) hours as needed.   ascorbic acid (VITAMIN C) 1000 MG tablet Take 1,000 mg by mouth daily.   Cholecalciferol (VITAMIN D-3) 25 MCG (1000 UT) CAPS Take 1,000 Units by mouth daily.   CRESTOR  5 MG tablet Take 5 mg by mouth daily.   cyanocobalamin 100 MCG tablet Take 100 mcg by mouth daily.   diclofenac  Sodium (VOLTAREN ) 1 % GEL Apply 4 g topically 4 (four) times daily. (Patient taking differently: Apply 4 g topically daily as needed (for pain).)   Ferrous Sulfate (IRON) 325 (65 Fe) MG TABS 1 tablet Orally Once a day As needed   furosemide (LASIX) 20 MG tablet Take 20 mg by mouth daily.   Magnesium 250 MG TABS Take 250 mg by mouth daily.   metoprolol tartrate (LOPRESSOR) 25 MG tablet Take 0.5 tablets (12.5 mg total) by mouth 2 (two) times daily.   Multiple Vitamin (MULTIVITAMIN ADULT PO) Take 1 tablet by mouth daily at 6 (six) AM.   naproxen sodium (ALEVE) 220 MG tablet Take 220 mg by mouth 2 (two) times daily as needed.   Omega-3 1000 MG CAPS Take by mouth.   omeprazole (PRILOSEC OTC) 20 MG tablet Take 20 mg by mouth daily.  Physical Exam:    VS:  BP 132/82   Pulse 78   Ht 5' 1.5 (1.562 m)   Wt 160 lb 9.6 oz (72.8 kg)   SpO2 98%   BMI 29.85 kg/m    Wt Readings from Last 3 Encounters:  09/06/24 160 lb 9.6 oz (72.8 kg)  07/26/24 168 lb 1.6 oz (76.2 kg)  03/28/24 165 lb 3.2 oz (74.9 kg)    GEN: Well nourished, well developed in no acute distress NECK: No JVD; No carotid bruits CARDIAC: RRR, no murmurs, rubs, gallops RESPIRATORY:  Clear to auscultation without rales, wheezing or rhonchi  ABDOMEN: Soft, non-tender, non-distended EXTREMITIES:  No edema; No acute deformity     Asessement and Plan:.    Chest pain/DOE: Nuclear stress test on 08/08/2024 was normal and low risk, mild coronary calcifications were present on attenuation correction CT imaging. Today she reports that she has not had any recurrent chest pain.  She continues to note some mild dyspnea on exertion such as when she is walking her dogs outside, walks with a cane.  She does note some intermittent increased lower extremity edema, question if a possible component of diastolic dysfunction.  Also discussed  possibility of OSA, patient is unsure if she snores at night, will discuss with her husband, notes that she does have difficulty sleeping.  Check CBC, basic metabolic profile and BNP today.  Reviewed ED precautions.  Continue Lasix 20 mg daily, Toprol tartrate 12.5 mg twice daily.  Near syncope: Reports improvement in recent weeks, denies any true syncope.  She reports that she now tries to stay well-hydrated, has noted improvement in symptoms.  Reviewed ED precautions.  Palpitations: Cardiac monitor worn for 13 days and 23 hours indicated an average heart rate of 86 bpm ranging from 59 to 197 bpm.  Predominant underlying rhythm was sinus rhythm.  She had 2 episodes of ventricular tachycardia, run with a fast interval lasting 7 beats with a max rate of 176 bpm, fastest episode was also longest.  She had 56 episodes of supraventricular tachycardia, run with a fast interval lasting 9 beats with a max rate of 197 bpm, longest lasting 15 beats with an average rate of 115 bpm.  Patient started on metoprolol tartrate 12.5 mg twice daily. Today she reports that her palpitations have significantly improved on metoprolol.  She reports that she will have them on occasion, notes they are well-controlled.  Continue metoprolol tartrate 12.5 mg twice daily.  Hyperlipidemia: Last lipid profile on 05/24/24 indicated total cholesterol 126, HDL 51, triglyceride 71 and LDL 60.  Continue Crestor 5 mg daily.  Aortic atherosclerosis: CT angio chest/aorta in 05/2023 indicated evidence of aortic atherosclerosis.  Continue Crestor 5 mg daily.   Disposition: F/u with Dr. Floretta in three months to establish care.   Signed, Peytyn Trine D Rella Egelston, NP

## 2024-09-06 ENCOUNTER — Encounter: Payer: Self-pay | Admitting: Cardiology

## 2024-09-06 ENCOUNTER — Ambulatory Visit: Attending: Cardiology | Admitting: Cardiology

## 2024-09-06 VITALS — BP 132/82 | HR 78 | Ht 61.5 in | Wt 160.6 lb

## 2024-09-06 DIAGNOSIS — I700 Atherosclerosis of aorta: Secondary | ICD-10-CM

## 2024-09-06 DIAGNOSIS — R55 Syncope and collapse: Secondary | ICD-10-CM

## 2024-09-06 DIAGNOSIS — E782 Mixed hyperlipidemia: Secondary | ICD-10-CM | POA: Diagnosis not present

## 2024-09-06 DIAGNOSIS — R0602 Shortness of breath: Secondary | ICD-10-CM | POA: Diagnosis not present

## 2024-09-06 DIAGNOSIS — R079 Chest pain, unspecified: Secondary | ICD-10-CM | POA: Diagnosis not present

## 2024-09-06 NOTE — Patient Instructions (Signed)
 Medication Instructions:  Your physician recommends that you continue on your current medications as directed. Please refer to the Current Medication list given to you today.  *If you need a refill on your cardiac medications before your next appointment, please call your pharmacy*  Lab Work: BMET, BNP, CBC  If you have labs (blood work) drawn today and your tests are completely normal, you will receive your results only by: MyChart Message (if you have MyChart) OR A paper copy in the mail If you have any lab test that is abnormal or we need to change your treatment, we will call you to review the results.  Testing/Procedures: None ordered  Follow-Up: At [facility], you and your health needs are our priority.  As part of our continuing mission to provide you with exceptional heart care, our providers are all part of one team.  This team includes your primary Cardiologist (physician) and Advanced Practice Providers or APPs (Physician Assistants and Nurse Practitioners) who all work together to provide you with the care you need, when you need it.  Your next appointment:   3 month(s)  Provider:   Georganna Archer, MD    We recommend signing up for the patient portal called MyChart.  Sign up information is provided on this After Visit Summary.  MyChart is used to connect with patients for Virtual Visits (Telemedicine).  Patients are able to view lab/test results, encounter notes, upcoming appointments, etc.  Non-urgent messages can be sent to your provider as well.   To learn more about what you can do with MyChart, go to [email or web address].

## 2024-09-07 LAB — CBC
Hematocrit: 35.5 % (ref 34.0–46.6)
Hemoglobin: 11.4 g/dL (ref 11.1–15.9)
MCH: 31.8 pg (ref 26.6–33.0)
MCHC: 32.1 g/dL (ref 31.5–35.7)
MCV: 99 fL — ABNORMAL HIGH (ref 79–97)
Platelets: 184 x10E3/uL (ref 150–450)
RBC: 3.58 x10E6/uL — ABNORMAL LOW (ref 3.77–5.28)
RDW: 11.8 % (ref 11.7–15.4)
WBC: 5.7 x10E3/uL (ref 3.4–10.8)

## 2024-09-07 LAB — BASIC METABOLIC PANEL WITH GFR
BUN/Creatinine Ratio: 21 (ref 12–28)
BUN: 15 mg/dL (ref 8–27)
CO2: 26 mmol/L (ref 20–29)
Calcium: 10.3 mg/dL (ref 8.7–10.3)
Chloride: 103 mmol/L (ref 96–106)
Creatinine, Ser: 0.7 mg/dL (ref 0.57–1.00)
Glucose: 80 mg/dL (ref 70–99)
Potassium: 3.7 mmol/L (ref 3.5–5.2)
Sodium: 144 mmol/L (ref 134–144)
eGFR: 86 mL/min/1.73 (ref >59)

## 2024-09-07 LAB — BRAIN NATRIURETIC PEPTIDE: BNP: 48.1 pg/mL (ref 0.0–100.0)

## 2024-09-09 ENCOUNTER — Ambulatory Visit: Admitting: Podiatry

## 2024-09-09 ENCOUNTER — Encounter: Payer: Self-pay | Admitting: Podiatry

## 2024-09-09 ENCOUNTER — Ambulatory Visit: Payer: Self-pay | Admitting: Cardiology

## 2024-09-09 VITALS — Ht 61.5 in | Wt 160.0 lb

## 2024-09-09 DIAGNOSIS — L84 Corns and callosities: Secondary | ICD-10-CM

## 2024-09-12 NOTE — Progress Notes (Signed)
 Subjective:   Patient ID: Gloria Whitney, female   DOB: 83 y.o.   MRN: [id number]   HPI Patient presents with chronic keratotic lesions bilateral plantarly that are very painful for her to walk   ROS      Objective:  Physical Exam  Neurovascular status intact with keratotic lesions plantar bilateral painful when pressed     Assessment:  Chronic keratotic lesion bilateral painful     Plan:  Debridement painful lesions bilateral no iatrogenic bleeding reappoint routine care

## 2024-10-12 NOTE — Progress Notes (Signed)
 Fracture Prevention Program- Follow Up Patient Visit    Reason for Visit/Interval History: Gloria Whitney, 83 y.o., female presents for follow up osteoporosis treatment and her 17th Prolia  injection. She continues to tolerate her treatment well without concerning side effects.   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   She is having to use the cane more than usual   FPP eval date: 2015   Has patient started/continued anti-osteoporosis medication (AOM) after the initial FPP visit? Yes  Bone Health Treatment History:  Below is her initial visit note with me in Feb 2015 indicating her need for further investigation and treatment recommendations for improved bone quality and strength.  HPI HPIPt is a 83 y/o AA female that has had 3 TKR revisions since 2010. Her initial and secondary replacement surgeries failed. Her last surgery was 4 years ago with a long stem revision. She has continued to have pain. Her recent xrays showed lucency around her hardware a noted on her report below. She was appropriately identified and referred for this medically necessary visit for further investigation and treatment recommendations for improved bone quality and strength for reduction of future fractures.    Bone Health History:   1. Have you had height loss or gotten shorter since your 20's? Yes Best estimate of how much in inches : Gloria Whitney states about a 2 and 1/4 inch height loss since her 20's.   2. Postmenopausal? yes, onset: mid adult years. Naturally or Surgically? Gloria Whitney went through menopause in her late 10's early 52's. She took HRT for about 3-5 years.   3. Did you ever take hormone replacements? Yes If so, how long? 3-5 years.   4. Currently smoke No. She does not drink alcohol   5. Have you had more than 2 falls in the past year No.   6. How active have you been for the last 12 months? Gloria Whitney states that she is relatively active on most days and with chair  yoga 3 times per week, regular yoga 1 day per week, Tia chia 1 time per week on most weeks.   7. How many caffeinated beverages do you have per day? She consumes a modest amount of caffeine on most days   8. Did either of your parents have a hip fracture after the age of 46? No.   Gloria Whitney has limited medical history known to be associated with compromised bone quality and strength with the exception of postmenopause and advanced age as well as current lucency around her hardware and history of height loss   10. Current fracture? No .   11. Any other broken bones since you turned 51 or older? no.   12. Have you had a Bone Density Scan or DXA? yes - states she had a bone density in the past year at her PCP's office..   13. Are you currently or have you ever taken any medication for bone loss? Yes she was treated with Boniva in the past for about 3-5 years and then treated with Fosamax for 3-5 years. She is currently taking Fosamax at this time.   14. Are you taking the nutritional supplements Calcium or Vitamin D? yes - She is taking Calcium daily and Vitamin D daily but is unsure of her dose. She takes Calcium once per day at present   15. Have you ever been treated for cancer with high beam radiation or had radioactive implants? No. Gloria Whitney has  never had any problems with her calcium levels in the past and denies any history of Paget's disease.             DI KNEE RIGHT 3VIEWS, Nov 16, 2013 09:56:46 AM   INDICATION: evaluateV54.81 Aftercare following joint replacement surgery   COMPARISON: Prior right knee radiograph dated 11/05/2012   CONCLUSION:   1. 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 The hardware remains intact. No periprosthetic fracture. Redemonstrated marked lucency surrounding the femoral and tibial components, compatible with loosening. As compared to the prior exam, the degree of lucency appears similar.  2. Similar debris/heterotopic  ossification surrounding the knee joint.  3. No acute fracture or traumatic malalignment.  4. Osteopenia.            I have personally reviewed the procedure note and/or have reviewed and interpreted this image/images.   Electronically Signed By Attending: Glendia Alm Simas, MD on 11/16/2013 2:15 PM       XRAY THORACOLUMBAR SPINE, AP AND LATERAL, Dec 06, 2013 10:33:17 AM   .   INDICATION: height loss781.91 Height loss   COMPARISON: None   .   FINDINGS:   . Alignment: Mild dextrocurvature of the lumbar spine.   . Bones: Mild height loss at the lower thoracic spine, likely at T10. Anterior wedging and approximately 30 % vertebral body height loss likely at T9. Sclerotic focus in the left aspect of the sacrum.   . Degenerative change: Multilevel disc height loss worst in the midthoracic spine and lower lumbar spine. Multilevel degenerative facet hypertrophy in the lumbar spine spanning L2-L3 through L5-S1. Close approximation and contact of adjacent spinous processes, which are enlarged with sclerosis of the margins.   . Soft tissues: No prevertebral swelling or masses.   . Extraspinal: No significant abnormalities in visualized portions of the chest, abdomen, and pelvis.   . Additional Comments: None   .   CONCLUSION:  1. Age indeterminant compression fractures in the lower thoracic spine, likely at T9 and T10 vertebral bodies. Approximately 30% height loss at T9 and mild height loss at T10 and without bony retropulsion.  2. Mild dextrocurvature of the lumbar spine.  3. Baastrup's disease.   SABRA                  CLINICAL DATA:  Postmenopausal. Remote history of Fosamax treatment 10 years ago. Patient currently takes denosumab .   EXAM: DUAL X-RAY ABSORPTIOMETRY (DXA) FOR BONE MINERAL DENSITY   TECHNIQUE: Bone mineral density measurements are performed of the spine, hip, and forearm, as appropriate, per International Society of Clinical Densitometry recommendations. The  pertinent regions of interest are reported below. Non-contributory values are not reported. Images are obtained for bone mineral density measurement and are not obtained for diagnostic purposes.   FINDINGS: AP LUMBAR SPINE L1-L4   Bone Mineral Density (BMD):  1.299 g/cm2   Young Adult T-Score:  2.3   Z-Score:  4.9   LEFT FEMUR neck   Bone Mineral Density (BMD):  0.642 g/cm2   Young Adult T-Score: -1.9   Z-Score:  0.4   RIGHT FOREARM (1/3 RADIUS)   Bone Mineral Density (BMD):  0.557   Young Adult T Score:  -2.3   Z Score:  0.7   Unit: This study was performed at [employer] on [employer](S/N [phone]), software version 13.3.   Scan quality: The scan quality is good. Exclusions: None   ASSESSMENT: Patient's diagnostic category is LOW BONE MASS/OSTEOPENIA by  WHO Criteria.   FRACTURE RISK: UNKNOWN, DUE TO HISTORY OF TREATMENT FOR OSTEOPOROSIS   FRAX: World Health Organization FRAX assessment of absolute fracture risk is not calculated for this patient because the patient is being treated for osteoporosis.   COMPARISON: None.   RECOMMENDATIONS   1. All patients should optimize calcium and vitamin D intake.   2. Consider FDA-approved medical therapies in postmenopausal women and men aged 32 years and older, based on the following:   - A hip or vertebral (clinical or morphometric) fracture   - T-score less than or equal to -2.5 at the femoral neck or spine after appropriate evaluation to exclude secondary causes   - Low bone mass (T-score between -1.0 and -2.5 at the femoral neck or spine) and a 10-year probability of a hip fracture greater than or equal to 3% or a 10-year probability of a major osteoporosis-related fracture greater than or equal to 20% based on the [place] -adapted WHO algorithm   - Clinician judgment and/or patient preferences may indicate treatment for people with 10-year fracture probabilities above or below these levels   3.  Patients with diagnosis of osteoporosis or at high risk for fracture should have regular bone mineral density tests. For patients eligible for Medicare, routine testing is allowed once every 2 years. The testing frequency can be increased to one year for patients who have rapidly progressing disease, those who are receiving or discontinuing medical therapy to restore bone mass, or have additional risk factors.     Electronically Signed   By: Almarie Daring M.D.   On: 09/11/2020 10:46          Ht Readings from Last 1 Encounters:  08/15/15 1.588 m (5' 2.5)         Wt Readings from Last 1 Encounters:  08/15/15 77.565 kg (171 lb)    Body mass index is 30.76 kg/(m^2).          Ht Readings from Last 1 Encounters:  08/18/17 1.588 m (5' 2.5)         Wt Readings from Last 1 Encounters:  08/18/17 76.2 kg (168 lb)    Body mass index is 30.24 kg/m.            Ht Readings from Last 1 Encounters:  02/16/18 1.588 m (5' 2.5)         Wt Readings from Last 1 Encounters:  02/16/18 75.9 kg (167 lb 6.4 oz)    Body mass index is 30.13 kg/m.          Ht Readings from Last 1 Encounters:  03/03/19 1.575 m (5' 2)         Wt Readings from Last 1 Encounters:  03/03/19 77.7 kg (171 lb 4.8 oz)    Body mass index is 31.33 kg/m.            Ht Readings from Last 1 Encounters:  09/06/19 1.575 m (5' 2)         Wt Readings from Last 1 Encounters:  09/06/19 74.5 kg (164 lb 4.8 oz)    Body mass index is 30.05 kg/m.       Estimated body mass index is 30.05 kg/m as calculated from the following:   Height as of 09/06/19: 1.575 m (5' 2).   Weight as of 09/06/19: 74.5 kg (164 lb 4.8 oz).    CLINICAL DATA:  Right knee pain after injury.   EXAM:  RIGHT KNEE 3 VIEWS   COMPARISON:  December 30, 2017  FINDINGS:  The patient is status post knee replacement. There appears to be a  fracture through the medial aspect of the femur just superior to the  most medial aspect of  the knee replacement. This is new since 2019.  The femoral hardware is otherwise in good position. The tibial  hardware is stable in appearance since 2019 with no obvious acute  fracture. The proximal fibula appears to be intact. No joint  effusion.    X-RAY BOTH KNEES (3 VIEWS), 08/22/2021 3:38 PM   INDICATION: pain \ M25.561 Bilateral chronic knee pain \ M25.562 Bilateral chronic knee pain \ G89.29 Bilateral chronic knee pain  COMPARISON: Right knee x-ray 11/13/2020: 2019 right knee x-ray   CONCLUSION:   Right knee: 1.  Postsurgical changes of fully constrained right total knee arthroplasty. Interval increase in lucency about the distal tibial shaft component when compared right knee x-ray from 2019 (most recent comparison from 11/13/2020 did not include most distal component of tibia). Similar degree of lucency about the remaining hardware as compared to before. No periprosthetic fracture. 2.  No acute fracture or traumatic malalignment. 3.  Small joint effusion with intra-articular debris. 4.  Patellar enthesopathy.   Left knee: 1.  No acute fracture or malalignment. 2.  Small joint effusion. 3.  Severe medial, moderate lateral, and moderate patellofemoral compartment degenerative changes. 4.  Chondrocalcinosis. 5.  Patellar enthesopathy.        Electronically Signed By Resident/Fellow: Lonell Ozell Groom, MD on 08/27/21  3:17 PM                          I have personally reviewed the procedure note and/or have reviewed and interpreted this image/images.   Electronically Signed By Attending: Alm Lemond Essex, MD on 08/27/2021  9:45 PM           Current Treatment Regimen:              Medication(s): In 2015 she reported she was treated with Boniva in the past for about 3-5 years and then treated with Fosamax for 3-5 years. She is currently taking Fosamax at the time of her initial visit. She then was switched to Forteo for 24 months and then transitioned to Prolia  ever 6  months. She continues on her Prolia  Side Effect(s): Patient denies any adverse effects and is tolerating medication well Taking Calcium supplements daily: Yes Taking Vitamin D supplements daily: Yes           Falls since last FPP visit: No Fracture(s) since last FPP visit: No     Current exercise routine: As for exercise, she has not been consistent with weight bearing or resistance exercises. She was doing some Tai Chi but has not been able to be consistent due to a recent family health crisis with her sister. This next year you plan to resume some exercise classes at the [facility] and start doing some pool exercises.    Last DXA: Done at the [facility] location on 10/08/2022 showing her osteopenia in all areas and her TBS is in normal range. No comparison given.  ASSESSMENT: Patient's diagnostic category is LOW BONE MASS/OSTEOPENIA by [facility] Criteria.   FRACTURE RISK: UNKNOWN, DUE TO HISTORY OF TREATMENT FOR OSTEOPOROSIS   FRAX: World Health Organization FRAX assessment of absolute fracture risk is not calculated for this patient because the patient is being treated for osteoporosis.       EXAM: DUAL X-RAY ABSORPTIOMETRY (DXA) FOR BONE MINERAL DENSITY  10/08/2022 10:25 am   CLINICAL DATA:  83 year old Female Postmenopausal. Screening for osteoporosis.   History of fragility fracture. History of vertebral fracture. Patient is or has been on glucocorticoid therapy. Patient is or has been on bone building therapies.   TECHNIQUE: An axial (e.g., hips, spine) and/or appendicular (e.g., radius) exam was performed, as appropriate, using [employer] at [employer]. Images are obtained for bone mineral density measurement and are not obtained for diagnostic purposes. [id number]   Trabecular Bone Score (TBS) was derived from the texture of the DXA image of the spine. TBS has been shown to be related to bone microarchitecture and fracture risk. It provides  information independent of bone mineral density and can be used to modify FRAX score.   Exclusions: L3 and L4 excluded.   COMPARISON:  Current and prior studies are not comparable because of dissimilar scan types or analysis methods.   FINDINGS: Scan quality: Good.   LUMBAR SPINE (L1-L2):   BMD (in g/cm2): 1.167   T-score: 1.7   Z-score: 3.6   LEFT FEMORAL NECK:   BMD (in g/cm2): 0.652   T-score: -1.8   Z-score: -0.3   LEFT TOTAL HIP:   BMD (in g/cm2): 0.812   T-score: -1.1   Z-score: 0.1   LEFT FOREARM (RADIUS 33%):   BMD (in g/cm2): 0.569   T-score: -2.1   Z-score: 1.1   FRAX 10-YEAR PROBABILITY OF FRACTURE:   Patient does not meet criteria for FRAX assessment due to bone building therapy.   TRABECULAR BONE SCORE (TBS): L1-L4= 1.258   The lumbar spine TBS: Partially Degraded microarchitecture.   IMPRESSION: Osteopenia based on BMD.   Fracture risk is unknown due to history of bone building therapy.   RECOMMENDATIONS: 1. All patients should optimize calcium and vitamin D intake.   2. Consider FDA-approved medical therapies in postmenopausal women and men aged 15 years and older, based on the following:   - A hip or vertebral (clinical or morphometric) fracture   - T-score less than or equal to -2.5 and secondary causes have been excluded.   - Low bone mass (T-score between -1.0 and -2.5) and a 10-year probability of a hip fracture greater than or equal to 3% or a 10-year probability of a major osteoporosis-related fracture greater than or equal to 20% based on the [place] -adapted WHO algorithm.   - Clinician judgment and/or patient preferences may indicate treatment for people with 10-year fracture probabilities above or below these levels   3. Patients with diagnosis of osteoporosis or at high risk for fracture should have regular bone mineral density tests. For patients eligible for Medicare, routine testing is allowed once every 2 years.  The testing frequency can be increased to one year for patients who have rapidly progressing disease, those who are receiving or discontinuing medical therapy to restore bone mass, or have additional risk factors.     Electronically Signed   By: Lael Hines M.D.   On: 10/08/2022 10:54      I have personally reviewed the procedure note and/or have reviewed and interpreted this image/images. Electronically Signed By: Rome Taft Hines DOUGLAS, MD on 10/08/2022 10:54 AM  Did patient have DXA scan scheduled before their FPP follow-up appointment? No     Dental Interventions since Last Visit: Gloria Whitney goes to the dentist on a regular basis and does not currently have any dental concerns Upcoming Planned Dental Procedures: none  Review of Systems: Review of Systems  Estimated body mass index is 31.01 kg/m as calculated from the following:   Height as of this encounter: 1.549 m (5' 1).   Weight as of this encounter: 74.4 kg (164 lb 1.6 oz).   Physical Exam: Physical Exam Constitutional:      Appearance: Normal appearance.  HENT:     Head: Normocephalic.  Pulmonary:     Effort: Pulmonary effort is normal.  Musculoskeletal:     Cervical back: Normal range of motion.  Neurological:     General: No focal deficit present.     Mental Status: She is alert and oriented to person, place, and time.  Psychiatric:        Mood and Affect: Mood normal.        Behavior: Behavior normal.        Thought Content: Thought content normal.        Judgment: Judgment normal.       Assessment: Mele was seen today for follow-up.  Diagnoses and all orders for this visit:  Senile osteoporosis  Medication monitoring encounter  At high risk for falls  Hx of fracture of vertebral column    Follow up: 6 months with the nurse for her 18th prolia  injection  Plan: Spent 24 minutes with patient face to face time today and over 50% of time was spent discussing her  osteoporosis  treatment and plan of care for her bone health. The patient will continue with the treatment regimen of Prolia  60 mg sub q every 6 months. We discussed potential common side effects and possible although low risk adverse effects like ONJ. Recommend routine dental cleanings and annual dental exams, good oral hygiene She will call with any questions or concerns prior to her next visit.   I explained the importance of timely follow up for the Prolia  injections in order to maximize treatment and reduce risk for rapid bone loss and fractures from missed doses. Patient verbalized understanding their today.   The patient will continue with Calcium and Vitamin D supplements and we discussed the importance of fall prevention as well as weight bearing and resistance exercises for better bone health. The patient will call with any questions or concerns prior to the next visit. Will check her BMP and Vitamin D 25 level today. Referral to Gloria Whitney for further evaluation for leg strength, core strengthening balance and falls prevention.

## 2024-11-02 ENCOUNTER — Ambulatory Visit: Attending: Nurse Practitioner | Admitting: Physical Therapy

## 2024-11-02 VITALS — BP 156/88 | HR 76

## 2024-11-02 DIAGNOSIS — M25562 Pain in left knee: Secondary | ICD-10-CM | POA: Insufficient documentation

## 2024-11-02 DIAGNOSIS — M25561 Pain in right knee: Secondary | ICD-10-CM | POA: Insufficient documentation

## 2024-11-02 DIAGNOSIS — R293 Abnormal posture: Secondary | ICD-10-CM | POA: Diagnosis present

## 2024-11-02 DIAGNOSIS — R2681 Unsteadiness on feet: Secondary | ICD-10-CM | POA: Insufficient documentation

## 2024-11-02 DIAGNOSIS — M6281 Muscle weakness (generalized): Secondary | ICD-10-CM | POA: Insufficient documentation

## 2024-11-02 DIAGNOSIS — R2689 Other abnormalities of gait and mobility: Secondary | ICD-10-CM | POA: Insufficient documentation

## 2024-11-02 DIAGNOSIS — G8929 Other chronic pain: Secondary | ICD-10-CM | POA: Diagnosis present

## 2024-11-02 NOTE — Therapy (Signed)
 " OUTPATIENT PHYSICAL THERAPY NEURO EVALUATION   Patient Name: Gloria Whitney MRN: [id number] DOB:1941-04-20, 84 y.o., female Today's Date: 11/02/2024   PCP: Rexanne Ingle, MD  REFERRING PROVIDER: Almond Arlean FALCON, FNP  END OF SESSION:  PT End of Session - 11/02/24 0803     Visit Number 1    Number of Visits 9   Plus eval   Date for Recertification  01/11/25    Authorization Type [employer] Medicare    PT Start Time 0801    PT Stop Time 0846    PT Time Calculation (min) 45 min    Activity Tolerance Patient tolerated treatment well    Behavior During Therapy WFL for tasks assessed/performed          Past Medical History:  Diagnosis Date   Anemia    BMI 30.0-30.9,adult    Cataracts, bilateral    Dyspnea    Endometrial mass    Goiter    HLD (hyperlipidemia)    Hypercalcemia    Hypercholesterolemia    Hypertension    Hypertensive retinopathy    Insomnia    Iron deficiency anemia    Leg edema    Neck pain    Osteoporosis    Overweight    Pain in soft tissues of limb    Senile osteoporosis    Vitamin D deficiency    Past Surgical History:  Procedure Laterality Date    SKIN MOLES REMOVED     APPENDECTOMY     BREAST BIOPSY     CHOLECYSTECTOMY N/A 02/11/2019   Procedure: LAPAROSCOPIC CHOLECYSTECTOMY WITH  INTRAOPERATIVE CHOLANGIOGRAM;  Surgeon: Rubin Calamity, MD;  Location: [facility] OR;  Service: General;  Laterality: N/A;   DENTAL IMPLANT     DILATION AND CURETTAGE OF UTERUS     REPLACEMENT TOTAL KNEE Right    x 3   tubal reconstruction     Patient Active Problem List   Diagnosis Date Noted   Fatigue 10/06/2023   Hoarseness 01/22/2022   Laryngopharyngeal reflux 01/22/2022   Bilateral chronic knee pain 09/17/2021   Closed nondisplaced fracture of medial condyle of right femur (HCC) 09/17/2020   Acute cholecystitis 02/10/2019   Medication monitoring encounter 02/16/2018   Primary osteoarthritis of left knee 12/30/2017   Hx of fracture of vertebral column 08/13/2016    At high risk for falls 01/24/2015   Compression fracture of vertebra, non-traumatic (HCC) 01/24/2015   Height loss 01/24/2015   Postmenopausal osteoporosis 01/24/2015   Vertigo 01/24/2015   Status post revision of total knee replacement 09/04/2011    ONSET DATE: 10/14/2024 (referral)   REFERRING DIAG: M81.0 (ICD-10-CM) - Senile osteoporosis Z91.81 (ICD-10-CM) - History of falling  THERAPY DIAG:  Muscle weakness (generalized)  Other abnormalities of gait and mobility  Unsteadiness on feet  Abnormal posture  Rationale for Evaluation and Treatment: Rehabilitation  SUBJECTIVE:  SUBJECTIVE STATEMENT: Pt presents holding SPC. States she has difficulty stepping on/off curbs and navigating stairs. Has episodes of vertigo, started in November. Started Meclizine at that time and it helped but does not take it regularly. Has had random, less intense episodes of vertigo since. Has not had a fall for about a year, but last fall occurred due to tripping on LLE (RLE is shorter 2/2 TKR revisions).   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 Her goal for this year is to return to classes.    Pt accompanied by: self  PERTINENT HISTORY: HLD, dyspnea, osteoporosis, hypertension, hyperlipidemia, and presyncope. 3 R TKR revisions since 2010  PAIN:  Are you having pain? Yes: NPRS scale: 0/10 at rest, 10/10 w/movement some days  Pain location: Bilateral knees  Pain description: varies, achy, sharp, throbbing, it is pain I cannot describe   PRECAUTIONS: Fall  RED FLAGS: None   WEIGHT BEARING RESTRICTIONS: No  FALLS: Has patient fallen in last 6 months? No  LIVING ENVIRONMENT: Lives with: lives with their spouse and 2 sisters  Lives in:  House/apartment Stairs: Yes: Internal: 7 steps; on right going up and External: 4 + 1 steps; none Has following equipment at home: Single point cane  PLOF: Independent  PATIENT GOALS: I would like to consistently be able to step up and step down. I would like to feel like I am going up the steps and not pulling up the steps. I would like to not be fearful of falling down the steps   OBJECTIVE:  Note: Objective measures were completed at Evaluation unless otherwise noted.  DIAGNOSTIC FINDINGS: From [place], FNP's note on 10/12/24:  Last DXA: Done at the [facility] location on 10/08/2022 showing her osteopenia in all areas and her TBS is in normal range. No comparison given.   COGNITION: Overall cognitive status: Within functional limits for tasks assessed   SENSATION: Pt reports numbness in both feet - not constant    EDEMA: Pt has chronic edema in distal BLEs    POSTURE: rounded shoulders, forward head, increased thoracic kyphosis, posterior pelvic tilt, and flexed trunk   LOWER EXTREMITY ROM:     Active  Right Eval Left Eval  Hip flexion    Hip extension    Hip abduction    Hip adduction    Hip internal rotation    Hip external rotation    Knee flexion    Knee extension    Ankle dorsiflexion    Ankle plantarflexion    Ankle inversion    Ankle eversion     (Blank rows = not tested)  LOWER EXTREMITY MMT:    MMT Right Eval Left Eval  Hip flexion    Hip extension    Hip abduction    Hip adduction    Hip internal rotation    Hip external rotation    Knee flexion    Knee extension    Ankle dorsiflexion    Ankle plantarflexion    Ankle inversion    Ankle eversion    (Blank rows = not tested)  BED MOBILITY:  Not tested Pt reports difficulty w/this depending on the day, increased difficulty getting in the bed   TRANSFERS: Sit to stand: Modified independence  Assistive device utilized: None     Stand to sit: Modified independence  Assistive device  utilized: None     Poor eccentric control, R knee at ~120' of  knee extension, heavy reliance on BUE support but good anterior weight shift noted    RAMP:  Not tested  CURB:  Not tested  STAIRS: Not tested GAIT: Gait pattern: step through pattern, decreased step length- Right, decreased stride length, knee flexed in stance- Right, lateral hip instability, and trunk flexed Distance walked: Various clinic distances  Assistive device utilized: Pt carries SPC but does not use it Level of assistance: Modified independence Comments: Pt ambulates w/kyphotic posture and PPT. Lateral lean to R side noted due to leg length discrepancy.    FUNCTIONAL TESTS:   [facility] PT Assessment - 11/02/24 0844       Balance   Balance Assessed Yes      Standardized Balance Assessment   Standardized Balance Assessment Five Times Sit to Stand    Five times sit to stand comments  63.44s   BUE support required on first 4 reps, pt unable to flex R knee to 90 degrees          VITALS   Vitals:   11/02/24 0824 11/02/24 0828  BP: (!) 162/102 (!) 156/88  Pulse: 77 76                                                                                                                                 TREATMENT:   Self-care/home management  Assessed vitals in LUE while seated (see above) and BP initially too elevated to complete evaluation. Pt reports she takes her medications at night, but did not take her metropolol this AM as it was too early. Pt also reports that her BP is all over the place. Reassessed BP after a few minutes and did reduce to be within limits for eval, so will continue to monitor in sessions.  Pt reports that she has random attacks of dyspnea, especially during exercise. Recommend pt obtain a pulse ox to keep on her while she is walking and can assess her HR and SpO2 when dyspnea occurs.   Discussed use of compression socks for edema management and pt reports her socks are too long for her leg  and hurt her knees. Informed pt we can look on [employer] for a shorter pair in future session.  Discussed PT POC and encouraged pt to return to [facility] as able. Informed pt that a healthy caregiver is a good caregiver and her health is also important. Pt verbalized understanding.   Encouraged pt to obtain 5# DB for home if she is able, as resistance training is important for bone health.    PATIENT EDUCATION: Education details: PT POC, eval findings, see self-care above  Person educated: Patient Education method: Medical Illustrator Education comprehension: verbalized understanding and needs further education  HOME EXERCISE PROGRAM: To be established   GOALS: Goals reviewed with patient? Yes  SHORT TERM GOALS: Target date: 11/30/2024   Pt will be consistent with initial HEP and gym routine for improved strength, balance, transfers and  gait. Baseline: not established on eval  Goal status: INITIAL  2.  Pt will improve 5 x STS to less than or equal to 50 seconds to demonstrate improved functional strength and transfer efficiency.   Baseline: 63.44s w/BUE support on first 4 reps  Goal status: INITIAL  3.  FGA to be assessed and LTG updated  Baseline:  Goal status: INITIAL  4.  to be assessed and STG/LTG updated  Baseline:  Goal status: INITIAL  LONG TERM GOALS: Target date: 12/28/2024   Pt will be consistent with final HEP and gym program for improved strength, balance, transfers and gait. Baseline:  Goal status: INITIAL  2.  Pt will improve 5 x STS to less than or equal to 40 seconds to demonstrate improved functional strength and transfer efficiency.   Baseline: 63.44s w/BUE support on first 4 reps  Goal status: INITIAL  3.  goal  Baseline:  Goal status: INITIAL  4.  FGA goal  Baseline:  Goal status: INITIAL  ASSESSMENT:  CLINICAL IMPRESSION: Patient is ab 84 year old female referred to Neuro OPPT for osteoporosis and falls. Pt's PMH is  significant for: HLD, dyspnea, osteoporosis, hypertension, hyperlipidemia, and presyncope. The following deficits were present during the exam: decreased functional strength, postural deficits, decreased activity tolerance, chronic edma and lack of R knee flexion A/ROM. Based on 5x STS, postural deficits and global deconditioning, pt is an incr risk for falls. Pt would benefit from skilled PT to address these impairments and functional limitations to maximize functional mobility and independence.    OBJECTIVE IMPAIRMENTS: Abnormal gait, cardiopulmonary status limiting activity, decreased activity tolerance, decreased balance, decreased endurance, decreased mobility, difficulty walking, decreased ROM, decreased strength, increased edema, impaired sensation, improper body mechanics, postural dysfunction, and pain  ACTIVITY LIMITATIONS: lifting, bending, standing, squatting, stairs, transfers, bed mobility, locomotion level, and caring for others  PARTICIPATION LIMITATIONS: interpersonal relationship, driving, shopping, community activity, yard work, and caregiver duties  PERSONAL FACTORS: Fitness, Past/current experiences, and 1 comorbidity: osteoporosis are also affecting patient's functional outcome.   REHAB POTENTIAL: Good  CLINICAL DECISION MAKING: Evolving/moderate complexity  EVALUATION COMPLEXITY: Moderate  PLAN:  PT FREQUENCY: 1x/week  PT DURATION: 8 weeks  PLANNED INTERVENTIONS: 97164- PT Re-evaluation, 97750- Physical Performance Testing, 97110-Therapeutic exercises, 97530- Therapeutic activity, [id number]- Neuromuscular re-education, 97535- Self Care, [id number]- Manual therapy, [id number]- Gait training, [phone]- Canalith repositioning, [id number]- Aquatic Therapy, [phone] (1-2 muscles), [id number] (3+ muscles)- Dry Needling, Patient/Family education, Balance training, Stair training, Taping, Joint mobilization, Spinal mobilization, Compression bandaging, Vestibular training, and DME instructions  PLAN FOR  NEXT SESSION: Monitor vitals. FGA and and update goals. Work on bed mobility, sit to stands and determining plan for gym/HEP. Did she get dumbbells?    Lyanne Kates E Alston Berrie, PT, DPT  11/02/2024, 8:54 AM        "

## 2024-11-09 ENCOUNTER — Ambulatory Visit: Admitting: Physical Therapy

## 2024-11-09 VITALS — BP 155/83 | HR 83

## 2024-11-09 DIAGNOSIS — M6281 Muscle weakness (generalized): Secondary | ICD-10-CM

## 2024-11-09 DIAGNOSIS — R2689 Other abnormalities of gait and mobility: Secondary | ICD-10-CM

## 2024-11-09 DIAGNOSIS — G8929 Other chronic pain: Secondary | ICD-10-CM

## 2024-11-09 DIAGNOSIS — R2681 Unsteadiness on feet: Secondary | ICD-10-CM

## 2024-11-09 DIAGNOSIS — R293 Abnormal posture: Secondary | ICD-10-CM

## 2024-11-09 NOTE — Therapy (Signed)
 " OUTPATIENT PHYSICAL THERAPY NEURO TREATMENT   Patient Name: Gloria Whitney MRN: [id number] DOB:24-Nov-1940, 84 y.o., female Today's Date: 11/09/2024   PCP: Rexanne Ingle, MD  REFERRING PROVIDER: Almond Arlean FALCON, FNP  END OF SESSION:  PT End of Session - 11/09/24 0806     Visit Number 2    Number of Visits 9   Plus eval   Date for Recertification  01/11/25    Authorization Type [employer] Medicare    PT Start Time 0803    PT Stop Time 0846    PT Time Calculation (min) 43 min    Activity Tolerance Patient tolerated treatment well    Behavior During Therapy WFL for tasks assessed/performed           Past Medical History:  Diagnosis Date   Anemia    BMI 30.0-30.9,adult    Cataracts, bilateral    Dyspnea    Endometrial mass    Goiter    HLD (hyperlipidemia)    Hypercalcemia    Hypercholesterolemia    Hypertension    Hypertensive retinopathy    Insomnia    Iron deficiency anemia    Leg edema    Neck pain    Osteoporosis    Overweight    Pain in soft tissues of limb    Senile osteoporosis    Vitamin D deficiency    Past Surgical History:  Procedure Laterality Date    SKIN MOLES REMOVED     APPENDECTOMY     BREAST BIOPSY     CHOLECYSTECTOMY N/A 02/11/2019   Procedure: LAPAROSCOPIC CHOLECYSTECTOMY WITH  INTRAOPERATIVE CHOLANGIOGRAM;  Surgeon: Rubin Calamity, MD;  Location: [facility] OR;  Service: General;  Laterality: N/A;   DENTAL IMPLANT     DILATION AND CURETTAGE OF UTERUS     REPLACEMENT TOTAL KNEE Right    x 3   tubal reconstruction     Patient Active Problem List   Diagnosis Date Noted   Fatigue 10/06/2023   Hoarseness 01/22/2022   Laryngopharyngeal reflux 01/22/2022   Bilateral chronic knee pain 09/17/2021   Closed nondisplaced fracture of medial condyle of right femur (HCC) 09/17/2020   Acute cholecystitis 02/10/2019   Medication monitoring encounter 02/16/2018   Primary osteoarthritis of left knee 12/30/2017   Hx of fracture of vertebral column  08/13/2016   At high risk for falls 01/24/2015   Compression fracture of vertebra, non-traumatic (HCC) 01/24/2015   Height loss 01/24/2015   Postmenopausal osteoporosis 01/24/2015   Vertigo 01/24/2015   Status post revision of total knee replacement 09/04/2011    ONSET DATE: 10/14/2024 (referral)   REFERRING DIAG: M81.0 (ICD-10-CM) - Senile osteoporosis Z91.81 (ICD-10-CM) - History of falling  THERAPY DIAG:  Muscle weakness (generalized)  Other abnormalities of gait and mobility  Unsteadiness on feet  Abnormal posture  Chronic pain of both knees  Rationale for Evaluation and Treatment: Rehabilitation  SUBJECTIVE:  SUBJECTIVE STATEMENT: Pt presents holding SPC and set of 3# DB. Reports increased knee pain today. Denies falls or acute changes. I am better than I deserve.    Pt accompanied by: self  PERTINENT HISTORY: HLD, dyspnea, osteoporosis, hypertension, hyperlipidemia, and presyncope. 3 R TKR revisions since 2010  PAIN:  Are you having pain? Yes: NPRS scale: 3-4/10 Pain location: Bilateral knees  Pain description: varies, achy, sharp, throbbing, it is pain I cannot describe   PRECAUTIONS: Fall  RED FLAGS: None   WEIGHT BEARING RESTRICTIONS: No  FALLS: Has patient fallen in last 6 months? No  LIVING ENVIRONMENT: Lives with: lives with their spouse and 2 sisters  Lives in: House/apartment Stairs: Yes: Internal: 7 steps; on right going up and External: 4 + 1 steps; none Has following equipment at home: Single point cane  PLOF: Independent  PATIENT GOALS: I would like to consistently be able to step up and step down. I would like to feel like I am going up the steps and not pulling up the steps. I would like to not be fearful of falling down the steps   OBJECTIVE:   Note: Objective measures were completed at Evaluation unless otherwise noted.  DIAGNOSTIC FINDINGS: From [place], FNP's note on 10/12/24:  Last DXA: Done at the [facility] location on 10/08/2022 showing her osteopenia in all areas and her TBS is in normal range. No comparison given.   COGNITION: Overall cognitive status: Within functional limits for tasks assessed   SENSATION: Pt reports numbness in both feet - not constant    EDEMA: Pt has chronic edema in distal BLEs    POSTURE: rounded shoulders, forward head, increased thoracic kyphosis, posterior pelvic tilt, and flexed trunk   LOWER EXTREMITY ROM:     Active  Right Eval Left Eval  Hip flexion    Hip extension    Hip abduction    Hip adduction    Hip internal rotation    Hip external rotation    Knee flexion    Knee extension    Ankle dorsiflexion    Ankle plantarflexion    Ankle inversion    Ankle eversion     (Blank rows = not tested)  LOWER EXTREMITY MMT:    MMT Right Eval Left Eval  Hip flexion    Hip extension    Hip abduction    Hip adduction    Hip internal rotation    Hip external rotation    Knee flexion    Knee extension    Ankle dorsiflexion    Ankle plantarflexion    Ankle inversion    Ankle eversion    (Blank rows = not tested)  BED MOBILITY:  Not tested Pt reports difficulty w/this depending on the day, increased difficulty getting in the bed   TRANSFERS: Sit to stand: Modified independence  Assistive device utilized: None     Stand to sit: Modified independence  Assistive device utilized: None     Poor eccentric control, R knee at ~120' of knee extension, heavy reliance on BUE support but good anterior weight shift noted    RAMP:  Not tested  CURB:  Not tested  STAIRS: Not tested GAIT: Gait pattern: step through pattern, decreased step length- Right, decreased stride length, knee flexed in stance- Right, lateral hip instability, and trunk flexed Distance walked: Various  clinic distances  Assistive device utilized: Pt carries SPC but does not use it Level of assistance: Modified independence Comments: Pt ambulates w/kyphotic posture and PPT.  Lateral lean to R side noted due to leg length discrepancy.    FUNCTIONAL TESTS:      VITALS   Vitals:   11/09/24 0809  BP: (!) 155/83  Pulse: 83                                                                                                                                  TREATMENT:   Self-care/home management  Assessed vitals in LUE while seated (see above) and systolic BP elevated but within limits for therapy.   Ther Act  SciFit multi-peaks level 3.0 for 8 minutes using BUE/BLEs for neural priming for reciprocal movement, dynamic cardiovascular warmup and increased amplitude of stepping. Pt completed 278 steps. Pt rated pain as 1-2/10 following activity.  For improved hip mobility, functional BLE strength and establishment of initial HEP:  Sit to stands w/no UE support, x5 reps. Pt demonstrated sever forward flexed posture w/knee extension prior than hip extension. Cued pt to maintain upright chest and squeeze her glutes to facilitate hip extension w/knee extension and pt able to perform w/improved speed and body mechanics.  Progressed to holding 3# DB in front rack, x5 reps. Added to HEP (see bolded below)  Seated march overs using 3# DB, x10 reps per side. Increased difficulty performing on RLE and reported tenderness along adductor magnus. Added to HEP (see bolded below)     PATIENT EDUCATION: Education details: Initial HEP, importance of hip mobility/strenghth for knee pain and functional mobility, encouragement to get up and move at least once every hour to reduce DOMS and improve mobility   Person educated: Patient Education method: Explanation, Demonstration, Tactile cues, Verbal cues, and Handouts Education comprehension: verbalized understanding, returned demonstration, verbal cues required,  tactile cues required, and needs further education  HOME EXERCISE PROGRAM: Access Code: [email or web address] URL: [email or web address] Date: 11/09/2024 Prepared by: Marlon Nuchem Grattan  Exercises - Resisted Sit-to-Stand With Dumbbell at Chest  - 2-3 sets - 5-7 reps - Seated march over  - 1 x daily - 7 x weekly - 2-3 sets - 10 reps   GOALS: Goals reviewed with patient? Yes  SHORT TERM GOALS: Target date: 11/30/2024   Pt will be consistent with initial HEP and gym routine for improved strength, balance, transfers and gait. Baseline: not established on eval  Goal status: INITIAL  2.  Pt will improve 5 x STS to less than or equal to 50 seconds to demonstrate improved functional strength and transfer efficiency.   Baseline: 63.44s w/BUE support on first 4 reps  Goal status: INITIAL  3.  FGA to be assessed and LTG updated  Baseline:  Goal status: INITIAL  4.  to be assessed and STG/LTG updated  Baseline:  Goal status: INITIAL  LONG TERM GOALS: Target date: 12/28/2024   Pt will be consistent with final HEP and gym program for improved strength, balance, transfers and gait. Baseline:  Goal status: INITIAL  2.  Pt  will improve 5 x STS to less than or equal to 40 seconds to demonstrate improved functional strength and transfer efficiency.   Baseline: 63.44s w/BUE support on first 4 reps  Goal status: INITIAL  3.  goal  Baseline:  Goal status: INITIAL  4.  FGA goal  Baseline:  Goal status: INITIAL  ASSESSMENT:  CLINICAL IMPRESSION: Emphasis of skilled PT session on assessing vitals, improved functional BLE strength and establishing initial HEP w/emphasis on resistance training and hip stability. Pt reporting 3-4 pain in knees at onset of session that did reduce to 1-2/10 by end of session. Established HEP using 3# DB that pt has obtained for home and pt encouraged to perform 2-3x/week w/emphasis on getting up and moving every hour to reduce DOMS and improve  mobility. Continue POC.    OBJECTIVE IMPAIRMENTS: Abnormal gait, cardiopulmonary status limiting activity, decreased activity tolerance, decreased balance, decreased endurance, decreased mobility, difficulty walking, decreased ROM, decreased strength, increased edema, impaired sensation, improper body mechanics, postural dysfunction, and pain  ACTIVITY LIMITATIONS: lifting, bending, standing, squatting, stairs, transfers, bed mobility, locomotion level, and caring for others  PARTICIPATION LIMITATIONS: interpersonal relationship, driving, shopping, community activity, yard work, and caregiver duties  PERSONAL FACTORS: Fitness, Past/current experiences, and 1 comorbidity: osteoporosis are also affecting patient's functional outcome.   REHAB POTENTIAL: Good  CLINICAL DECISION MAKING: Evolving/moderate complexity  EVALUATION COMPLEXITY: Moderate  PLAN:  PT FREQUENCY: 1x/week  PT DURATION: 8 weeks  PLANNED INTERVENTIONS: 97164- PT Re-evaluation, 97750- Physical Performance Testing, 97110-Therapeutic exercises, 97530- Therapeutic activity, [id number]- Neuromuscular re-education, 97535- Self Care, 02859- Manual therapy, [id number]- Gait training, [phone]- Canalith repositioning, [id number]- Aquatic Therapy, [phone] (1-2 muscles), 20561 (3+ muscles)- Dry Needling, Patient/Family education, Balance training, Stair training, Taping, Joint mobilization, Spinal mobilization, Compression bandaging, Vestibular training, and DME instructions  PLAN FOR NEXT SESSION: Monitor vitals. FGA and and update goals. Work on bed mobility, sit to stands and determining plan for gym/HEP. Glute bridges w/weight, clamshells   Judson Tsan E Caress Reffitt, PT, DPT  11/09/2024, 8:49 AM        "

## 2024-11-16 ENCOUNTER — Ambulatory Visit: Admitting: Physical Therapy

## 2024-11-23 ENCOUNTER — Ambulatory Visit: Admitting: Physical Therapy

## 2024-11-23 VITALS — BP 155/95 | HR 85

## 2024-11-23 DIAGNOSIS — R2681 Unsteadiness on feet: Secondary | ICD-10-CM

## 2024-11-23 DIAGNOSIS — R2689 Other abnormalities of gait and mobility: Secondary | ICD-10-CM

## 2024-11-23 DIAGNOSIS — M6281 Muscle weakness (generalized): Secondary | ICD-10-CM

## 2024-11-23 NOTE — Therapy (Signed)
 " OUTPATIENT PHYSICAL THERAPY NEURO TREATMENT   Patient Name: Gloria Whitney MRN: [id number] DOB:Mar 06, 1941, 84 y.o., female Today's Date: 11/23/2024   PCP: Rexanne Ingle, MD  REFERRING PROVIDER: Almond Arlean FALCON, FNP  END OF SESSION:  PT End of Session - 11/23/24 0805     Visit Number 3    Number of Visits 9   Plus eval   Date for Recertification  01/11/25    Authorization Type [employer] Medicare    PT Start Time 0804   Pt in restroom   PT Stop Time 0852    PT Time Calculation (min) 48 min    Activity Tolerance Patient tolerated treatment well;Treatment limited secondary to medical complications (Comment)   HTN and vertigo   Behavior During Therapy WFL for tasks assessed/performed            Past Medical History:  Diagnosis Date   Anemia    BMI 30.0-30.9,adult    Cataracts, bilateral    Dyspnea    Endometrial mass    Goiter    HLD (hyperlipidemia)    Hypercalcemia    Hypercholesterolemia    Hypertension    Hypertensive retinopathy    Insomnia    Iron deficiency anemia    Leg edema    Neck pain    Osteoporosis    Overweight    Pain in soft tissues of limb    Senile osteoporosis    Vitamin D deficiency    Past Surgical History:  Procedure Laterality Date    SKIN MOLES REMOVED     APPENDECTOMY     BREAST BIOPSY     CHOLECYSTECTOMY N/A 02/11/2019   Procedure: LAPAROSCOPIC CHOLECYSTECTOMY WITH  INTRAOPERATIVE CHOLANGIOGRAM;  Surgeon: Rubin Calamity, MD;  Location: [facility] OR;  Service: General;  Laterality: N/A;   DENTAL IMPLANT     DILATION AND CURETTAGE OF UTERUS     REPLACEMENT TOTAL KNEE Right    x 3   tubal reconstruction     Patient Active Problem List   Diagnosis Date Noted   Fatigue 10/06/2023   Hoarseness 01/22/2022   Laryngopharyngeal reflux 01/22/2022   Bilateral chronic knee pain 09/17/2021   Closed nondisplaced fracture of medial condyle of right femur (HCC) 09/17/2020   Acute cholecystitis 02/10/2019   Medication monitoring encounter  02/16/2018   Primary osteoarthritis of left knee 12/30/2017   Hx of fracture of vertebral column 08/13/2016   At high risk for falls 01/24/2015   Compression fracture of vertebra, non-traumatic (HCC) 01/24/2015   Height loss 01/24/2015   Postmenopausal osteoporosis 01/24/2015   Vertigo 01/24/2015   Status post revision of total knee replacement 09/04/2011    ONSET DATE: 10/14/2024 (referral)   REFERRING DIAG: M81.0 (ICD-10-CM) - Senile osteoporosis Z91.81 (ICD-10-CM) - History of falling  THERAPY DIAG:  Muscle weakness (generalized)  Other abnormalities of gait and mobility  Unsteadiness on feet  Rationale for Evaluation and Treatment: Rehabilitation  SUBJECTIVE:  SUBJECTIVE STATEMENT: Pt presents holding SPC. States she has had a rough couple weeks, has had vertigo attacks and feels disoriented. Took Meclizine today. No falls.    Pt accompanied by: self  PERTINENT HISTORY: HLD, dyspnea, osteoporosis, hypertension, hyperlipidemia, and presyncope. 3 R TKR revisions since 2010  PAIN:  Are you having pain? Yes: NPRS scale: 0/10 Pain location: Bilateral knees  Pain description: varies, achy, sharp, throbbing, it is pain I cannot describe   PRECAUTIONS: Fall  RED FLAGS: None   WEIGHT BEARING RESTRICTIONS: No  FALLS: Has patient fallen in last 6 months? No  LIVING ENVIRONMENT: Lives with: lives with their spouse and 2 sisters  Lives in: House/apartment Stairs: Yes: Internal: 7 steps; on right going up and External: 4 + 1 steps; none Has following equipment at home: Single point cane  PLOF: Independent  PATIENT GOALS: I would like to consistently be able to step up and step down. I would like to feel like I am going up the steps and not pulling up the steps. I would like to not be  fearful of falling down the steps   OBJECTIVE:  Note: Objective measures were completed at Evaluation unless otherwise noted.  DIAGNOSTIC FINDINGS: From [place], FNP's note on 10/12/24:  Last DXA: Done at the [facility] location on 10/08/2022 showing her osteopenia in all areas and her TBS is in normal range. No comparison given.   COGNITION: Overall cognitive status: Within functional limits for tasks assessed   SENSATION: Pt reports numbness in both feet - not constant    EDEMA: Pt has chronic edema in distal BLEs    POSTURE: rounded shoulders, forward head, increased thoracic kyphosis, posterior pelvic tilt, and flexed trunk   LOWER EXTREMITY ROM:     Active  Right Eval Left Eval  Hip flexion    Hip extension    Hip abduction    Hip adduction    Hip internal rotation    Hip external rotation    Knee flexion    Knee extension    Ankle dorsiflexion    Ankle plantarflexion    Ankle inversion    Ankle eversion     (Blank rows = not tested)  LOWER EXTREMITY MMT:    MMT Right Eval Left Eval  Hip flexion    Hip extension    Hip abduction    Hip adduction    Hip internal rotation    Hip external rotation    Knee flexion    Knee extension    Ankle dorsiflexion    Ankle plantarflexion    Ankle inversion    Ankle eversion    (Blank rows = not tested)  BED MOBILITY:  Not tested Pt reports difficulty w/this depending on the day, increased difficulty getting in the bed   TRANSFERS: Sit to stand: Modified independence  Assistive device utilized: None     Stand to sit: Modified independence  Assistive device utilized: None     Poor eccentric control, R knee at ~120' of knee extension, heavy reliance on BUE support but good anterior weight shift noted    RAMP:  Not tested  CURB:  Not tested  STAIRS: Not tested GAIT: Gait pattern: step through pattern, decreased step length- Right, decreased stride length, knee flexed in stance- Right, lateral hip  instability, and trunk flexed Distance walked: Various clinic distances  Assistive device utilized: SPC Level of assistance: SBA and CGA Comments: Pt reporting increased dizziness today, using SPC on R side and requiring SBA-CGA  for steadying assist due to lateral drift to R.    FUNCTIONAL TESTS:      VITALS   Vitals:   11/23/24 0809 11/23/24 0823  BP: (!) 162/95 (!) 155/95  Pulse: 85 85                                                                                                                                   TREATMENT:   Self-care/home management  Assessed vitals in LUE while seated (see above) and BP hypertensive today. Pt reports she took her BP medications but was stressed this AM w/her husband driving her to clinic. Continued to monitor throughout session. Pt denied lightheadedness, difficulty swallowing/speaking or changes to vision.   Ther Act  SciFit multi-peaks level 3.5 for 8 minutes using BUE/BLEs for neural priming for reciprocal movement, dynamic cardiovascular warmup and increased amplitude of stepping. Pt completed 578 steps. Reassessed vitals following activity in LUE (see above) and BP still elevated, but systolic did reduce slightly.  For improved posterior chain strength, hip ROM and strength and core stability:  Supine glute bridges, x10 reps. Pt required min A to fully flex R knee to get into position initially. Added to HEP (see bolded below).  Progressed to 10 reps w/two 3# dumbbells on thighs, which was very challenging for pt. Informed pt she can perform bridges at home either weighted or unweighted depending on how she is feeling.  Sidelying clamshells, x10 reps per side. Decreased ROM noted bilaterally and pt reported feeling discomfort along ITB on BLEs. Added to HEP (see bolded below)  Pt performed supine > sit edge of mat slowly, reporting vertigo w/movement. After several minutes of seated rest, vertigo did not dissipate, so escorted pt out of  clinic to find her husband who then escorted her to her car. Pt using cane to assist w/balance as she reports feeling as though she is drifting to the R. States she always carries her cane due to this but rarely needs to use it.     PATIENT EDUCATION: Education details: Updates to HEP  Person educated: Patient Education method: Programmer, Multimedia, Demonstration, Actor cues, Verbal cues, and Handouts Education comprehension: verbalized understanding, returned demonstration, verbal cues required, tactile cues required, and needs further education  HOME EXERCISE PROGRAM: Access Code: [email or web address] URL: [email or web address] Date: 11/09/2024 Prepared by: Marlon Averyana Pillars  Exercises - Resisted Sit-to-Stand With Dumbbell at Chest  - 2-3 sets - 5-7 reps - Seated march over  - 1 x daily - 7 x weekly - 2-3 sets - 10 reps  - Supine Bridge  - 1 x daily - 7 x weekly - 2-3 sets - 10 reps - Clamshell  - 1 x daily - 7 x weekly - 2-3 sets - 10 reps  GOALS: Goals reviewed with patient? Yes  SHORT TERM GOALS: Target date: 11/30/2024   Pt will be consistent with initial HEP and gym routine for improved strength, balance, transfers and  gait. Baseline: not established on eval  Goal status: INITIAL  2.  Pt will improve 5 x STS to less than or equal to 50 seconds to demonstrate improved functional strength and transfer efficiency.   Baseline: 63.44s w/BUE support on first 4 reps  Goal status: INITIAL  3.  FGA to be assessed and LTG updated  Baseline:  Goal status: INITIAL  4.  to be assessed and STG/LTG updated  Baseline:  Goal status: INITIAL  LONG TERM GOALS: Target date: 12/28/2024   Pt will be consistent with final HEP and gym program for improved strength, balance, transfers and gait. Baseline:  Goal status: INITIAL  2.  Pt will improve 5 x STS to less than or equal to 40 seconds to demonstrate improved functional strength and transfer efficiency.   Baseline: 63.44s w/BUE  support on first 4 reps  Goal status: INITIAL  3.  goal  Baseline:  Goal status: INITIAL  4.  FGA goal  Baseline:  Goal status: INITIAL  ASSESSMENT:  CLINICAL IMPRESSION: Session limited as pt continues to have vertigo and BP more hypertensive today. Pt reports taking Meclizine prior to session, but continued to experience vertigo symptoms. Performed majority of session in supine due to HTN and vertigo. Pt challenged by bridges and clamshells due to functional hip weakness, so added to HEP. Escorted pt out of session due to increased instability and drift to R side, which pt reports is normal for her when she has vertigo. Continue POC.    OBJECTIVE IMPAIRMENTS: Abnormal gait, cardiopulmonary status limiting activity, decreased activity tolerance, decreased balance, decreased endurance, decreased mobility, difficulty walking, decreased ROM, decreased strength, increased edema, impaired sensation, improper body mechanics, postural dysfunction, and pain  ACTIVITY LIMITATIONS: lifting, bending, standing, squatting, stairs, transfers, bed mobility, locomotion level, and caring for others  PARTICIPATION LIMITATIONS: interpersonal relationship, driving, shopping, community activity, yard work, and caregiver duties  PERSONAL FACTORS: Fitness, Past/current experiences, and 1 comorbidity: osteoporosis are also affecting patient's functional outcome.   REHAB POTENTIAL: Good  CLINICAL DECISION MAKING: Evolving/moderate complexity  EVALUATION COMPLEXITY: Moderate  PLAN:  PT FREQUENCY: 1x/week  PT DURATION: 8 weeks  PLANNED INTERVENTIONS: 97164- PT Re-evaluation, 97750- Physical Performance Testing, 97110-Therapeutic exercises, 97530- Therapeutic activity, [id number]- Neuromuscular re-education, 97535- Self Care, 02859- Manual therapy, [id number]- Gait training, [id number]- Canalith repositioning, [id number]- Aquatic Therapy, [phone] (1-2 muscles), [id number] (3+ muscles)- Dry Needling, Patient/Family education,  Balance training, Stair training, Taping, Joint mobilization, Spinal mobilization, Compression bandaging, Vestibular training, and DME instructions  PLAN FOR NEXT SESSION: Monitor vitals. STG. FGA and and update goals. Work on bed mobility, sit to stands and determining plan for gym/HEP.    Devann Cribb E Blaise Grieshaber, PT, DPT  11/23/2024, 9:32 AM        "

## 2024-11-30 ENCOUNTER — Ambulatory Visit: Admitting: Physical Therapy

## 2024-11-30 VITALS — BP 139/87 | HR 88

## 2024-11-30 DIAGNOSIS — R2681 Unsteadiness on feet: Secondary | ICD-10-CM

## 2024-11-30 DIAGNOSIS — M6281 Muscle weakness (generalized): Secondary | ICD-10-CM

## 2024-11-30 DIAGNOSIS — R2689 Other abnormalities of gait and mobility: Secondary | ICD-10-CM

## 2024-11-30 DIAGNOSIS — G8929 Other chronic pain: Secondary | ICD-10-CM

## 2024-11-30 NOTE — Therapy (Signed)
 " OUTPATIENT PHYSICAL THERAPY NEURO TREATMENT   Patient Name: Gloria Whitney MRN: [id number] DOB:[birth year], 84 y.o., female Today's Date: 11/30/2024   PCP: Rexanne Ingle, MD  REFERRING PROVIDER: Almond Arlean FALCON, FNP  END OF SESSION:  PT End of Session - 11/30/24 0804     Visit Number 4    Number of Visits 9   Plus eval   Date for Recertification  01/11/25    Authorization Type [employer] Medicare    PT Start Time 0803    PT Stop Time 0844    PT Time Calculation (min) 41 min    Activity Tolerance Patient tolerated treatment well    Behavior During Therapy WFL for tasks assessed/performed            Past Medical History:  Diagnosis Date   Anemia    BMI 30.0-30.9,adult    Cataracts, bilateral    Dyspnea    Endometrial mass    Goiter    HLD (hyperlipidemia)    Hypercalcemia    Hypercholesterolemia    Hypertension    Hypertensive retinopathy    Insomnia    Iron deficiency anemia    Leg edema    Neck pain    Osteoporosis    Overweight    Pain in soft tissues of limb    Senile osteoporosis    Vitamin D deficiency    Past Surgical History:  Procedure Laterality Date    SKIN MOLES REMOVED     APPENDECTOMY     BREAST BIOPSY     CHOLECYSTECTOMY N/A 02/11/2019   Procedure: LAPAROSCOPIC CHOLECYSTECTOMY WITH  INTRAOPERATIVE CHOLANGIOGRAM;  Surgeon: Rubin Calamity, MD;  Location: [facility] OR;  Service: General;  Laterality: N/A;   DENTAL IMPLANT     DILATION AND CURETTAGE OF UTERUS     REPLACEMENT TOTAL KNEE Right    x 3   tubal reconstruction     Patient Active Problem List   Diagnosis Date Noted   Fatigue 10/06/2023   Hoarseness 01/22/2022   Laryngopharyngeal reflux 01/22/2022   Bilateral chronic knee pain 09/17/2021   Closed nondisplaced fracture of medial condyle of right femur (HCC) 09/17/2020   Acute cholecystitis 02/10/2019   Medication monitoring encounter 02/16/2018   Primary osteoarthritis of left knee 12/30/2017   Hx of fracture of vertebral column  08/13/2016   At high risk for falls 01/24/2015   Compression fracture of vertebra, non-traumatic (HCC) 01/24/2015   Height loss 01/24/2015   Postmenopausal osteoporosis 01/24/2015   Vertigo 01/24/2015   Status post revision of total knee replacement 09/04/2011    ONSET DATE: 10/14/2024 (referral)   REFERRING DIAG: M81.0 (ICD-10-CM) - Senile osteoporosis Z91.81 (ICD-10-CM) - History of falling  THERAPY DIAG:  Muscle weakness (generalized)  Other abnormalities of gait and mobility  Unsteadiness on feet  Chronic pain of both knees  Rationale for Evaluation and Treatment: Rehabilitation  SUBJECTIVE:  SUBJECTIVE STATEMENT: Pt presents holding SPC, eating a pear. Denies dizziness today, no falls. HEP is going okay, still challenging to get out of the chair at times.    Pt accompanied by: self  PERTINENT HISTORY: HLD, dyspnea, osteoporosis, hypertension, hyperlipidemia, and presyncope. 3 R TKR revisions since 2010  PAIN:  Are you having pain? Yes: NPRS scale: 0/10 Pain location: Bilateral knees  Pain description: varies, achy, sharp, throbbing, it is pain I cannot describe   PRECAUTIONS: Fall  RED FLAGS: None   WEIGHT BEARING RESTRICTIONS: No  FALLS: Has patient fallen in last 6 months? No  LIVING ENVIRONMENT: Lives with: lives with their spouse and 2 sisters  Lives in: House/apartment Stairs: Yes: Internal: 7 steps; on right going up and External: 4 + 1 steps; none Has following equipment at home: Single point cane  PLOF: Independent  PATIENT GOALS: I would like to consistently be able to step up and step down. I would like to feel like I am going up the steps and not pulling up the steps. I would like to not be fearful of falling down the steps   OBJECTIVE:  Note: Objective  measures were completed at Evaluation unless otherwise noted.  DIAGNOSTIC FINDINGS: From [place], FNP's note on 10/12/24:  Last DXA: Done at the [facility] location on 10/08/2022 showing her osteopenia in all areas and her TBS is in normal range. No comparison given.   COGNITION: Overall cognitive status: Within functional limits for tasks assessed   SENSATION: Pt reports numbness in both feet - not constant    EDEMA: Pt has chronic edema in distal BLEs    POSTURE: rounded shoulders, forward head, increased thoracic kyphosis, posterior pelvic tilt, and flexed trunk   LOWER EXTREMITY ROM:     Active  Right Eval Left Eval  Hip flexion    Hip extension    Hip abduction    Hip adduction    Hip internal rotation    Hip external rotation    Knee flexion    Knee extension    Ankle dorsiflexion    Ankle plantarflexion    Ankle inversion    Ankle eversion     (Blank rows = not tested)  LOWER EXTREMITY MMT:    MMT Right Eval Left Eval  Hip flexion    Hip extension    Hip abduction    Hip adduction    Hip internal rotation    Hip external rotation    Knee flexion    Knee extension    Ankle dorsiflexion    Ankle plantarflexion    Ankle inversion    Ankle eversion    (Blank rows = not tested)  BED MOBILITY:  Not tested Pt reports difficulty w/this depending on the day, increased difficulty getting in the bed   TRANSFERS: Sit to stand: Modified independence  Assistive device utilized: None     Stand to sit: Modified independence  Assistive device utilized: None     Poor eccentric control, R knee at ~120' of knee extension, heavy reliance on BUE support but good anterior weight shift noted    RAMP:  Not tested  CURB:  Not tested  STAIRS: Not tested GAIT: Gait pattern: step through pattern, decreased step length- Right, decreased stride length, knee flexed in stance- Right, lateral hip instability, and trunk flexed Distance walked: Various clinic distances   Assistive device utilized: none Level of assistance: Modified independence Comments: Pt reports feeling more stable today, no dizziness reported  FUNCTIONAL TESTS:   [facility] PT Assessment - 11/30/24 0825       Balance   Balance Assessed Yes      Standardized Balance Assessment   Standardized Balance Assessment 10 meter walk test;Five Times Sit to Stand    Five times sit to stand comments  26.97s   no UE support   10 Meter Walk 0.94 m/s   15m over 10.69s w/o AD           VITALS   Vitals:   11/30/24 0808  BP: 139/87  Pulse: 88                                                                                                                                    TREATMENT:   Self-care/home management  Assessed vitals in LUE while seated (see above) and BP WNL  Ther Act/Physical Performance  SciFit multi-peaks level 5.5 for 8 minutes using BUE/BLEs for neural priming for reciprocal movement, dynamic cardiovascular warmup and increased amplitude of stepping. Pt completed 517 steps.    [facility] PT Assessment - 11/30/24 0825       Balance   Balance Assessed Yes      Standardized Balance Assessment   Standardized Balance Assessment 10 meter walk test;Five Times Sit to Stand    Five times sit to stand comments  26.97s   no UE support   10 Meter Walk 0.94 m/s   61m over 10.69s w/o AD        For improved functional hip strength, lateral weight shifting and eccentric control:  Lateral monster walks w/green theraband around distal quads, x3 reps per side in // bars. Min cues to maintain slight hip abduction throughout and to avoid sliding feet.  Fwd/retro monster walks w/green theraband around distal quads, x3 reps per direction in // bars. Min cues for wide steps, especially in retro direction.  Added both to HEP (see bolded below)     PATIENT EDUCATION: Education details: Updates to HEP, goal results  Person educated: Patient Education method: Explanation, Demonstration,  Tactile cues, Verbal cues, and Handouts Education comprehension: verbalized understanding, returned demonstration, verbal cues required, tactile cues required, and needs further education  HOME EXERCISE PROGRAM: Access Code: [email or web address] URL: [email or web address] Date: 11/09/2024 Prepared by: Marlon Tyashia Morrisette  Exercises - Resisted Sit-to-Stand With Dumbbell at Chest  - 2-3 sets - 5-7 reps - Seated march over  - 1 x daily - 7 x weekly - 2-3 sets - 10 reps  - Supine Bridge  - 1 x daily - 7 x weekly - 2-3 sets - 10 reps - Clamshell  - 1 x daily - 7 x weekly - 2-3 sets - 10 reps - Side Stepping with Resistance at [employer] and Counter Support  - 3-5 reps - Forward Backward Monster Walk with Band at [employer] and [employer]  - 3-5 reps  GOALS: Goals reviewed with patient? Yes  SHORT TERM GOALS:  Target date: 11/30/2024   Pt will be consistent with initial HEP and gym routine for improved strength, balance, transfers and gait. Baseline: not established on eval  Goal status: MET  2.  Pt will improve 5 x STS to less than or equal to 50 seconds to demonstrate improved functional strength and transfer efficiency.   Baseline: 63.44s w/BUE support on first 4 reps; 26.97s no UE support (2/4) Goal status: MET  3.  FGA to be assessed and LTG updated  Baseline:  Goal status: INITIAL  4.  to be assessed and LTG updated  Baseline:  Goal status: MET   LONG TERM GOALS: Target date: 12/28/2024   Pt will be consistent with final HEP and gym program for improved strength, balance, transfers and gait. Baseline:  Goal status: INITIAL  2.  Pt will improve 5 x STS to less than or equal to 21 seconds to demonstrate improved functional strength and transfer efficiency.   Baseline: 63.44s w/BUE support on first 4 reps; 26.97s w/no UE support (2/4)  Goal status: REVISED   3.  Pt will improve gait velocity to at least 1.0 m/s without AD for improved gait efficiency and independence    Baseline: 0.94 m/s no AD (2/4)  Goal status: INITIAL  4.  FGA goal  Baseline:  Goal status: INITIAL  ASSESSMENT:  CLINICAL IMPRESSION: Emphasis of skilled PT session on initiating STG assessment and functional hip strength. Pt has met 3 of 4 STGs thus far, with significant improvement noted in 5x STS, improving her score from 63s w/UE support to 26s without UE support. Pt has been compliant w/HEP per pt report and per score on 5x STS and completed her in 0.94 m/s without AD. Will assess FGA next session. Pt continues to be limited by bilateral knee pain, leg length discrepancy following several R TKA revisions and functional hip weakness, so will benefit from skilled PT for improved strength and functional mobility. Continue POC.    OBJECTIVE IMPAIRMENTS: Abnormal gait, cardiopulmonary status limiting activity, decreased activity tolerance, decreased balance, decreased endurance, decreased mobility, difficulty walking, decreased ROM, decreased strength, increased edema, impaired sensation, improper body mechanics, postural dysfunction, and pain  ACTIVITY LIMITATIONS: lifting, bending, standing, squatting, stairs, transfers, bed mobility, locomotion level, and caring for others  PARTICIPATION LIMITATIONS: interpersonal relationship, driving, shopping, community activity, yard work, and caregiver duties  PERSONAL FACTORS: Fitness, Past/current experiences, and 1 comorbidity: osteoporosis are also affecting patient's functional outcome.   REHAB POTENTIAL: Good  CLINICAL DECISION MAKING: Evolving/moderate complexity  EVALUATION COMPLEXITY: Moderate  PLAN:  PT FREQUENCY: 1x/week  PT DURATION: 8 weeks  PLANNED INTERVENTIONS: 97164- PT Re-evaluation, 97750- Physical Performance Testing, 97110-Therapeutic exercises, 97530- Therapeutic activity, [id number]- Neuromuscular re-education, 97535- Self Care, 02859- Manual therapy, [id number]- Gait training, [phone]- Canalith repositioning, [id number]-  Aquatic Therapy, [phone] (1-2 muscles), 20561 (3+ muscles)- Dry Needling, Patient/Family education, Balance training, Stair training, Taping, Joint mobilization, Spinal mobilization, Compression bandaging, Vestibular training, and DME instructions  PLAN FOR NEXT SESSION: Monitor vitals. FGA and update LTG. Work on bed mobility, sit to stands, step ups, hip abductor strength    Laurell Coalson E Atul Delucia, PT, DPT  11/30/2024, 8:47 AM        "

## 2024-12-07 ENCOUNTER — Ambulatory Visit: Admitting: Physical Therapy

## 2024-12-12 ENCOUNTER — Ambulatory Visit: Admitting: Student in an Organized Health Care Education/Training Program

## 2024-12-14 ENCOUNTER — Ambulatory Visit: Admitting: Physical Therapy

## 2024-12-21 ENCOUNTER — Ambulatory Visit: Admitting: Physical Therapy

## 2024-12-28 ENCOUNTER — Ambulatory Visit: Admitting: Physical Therapy

## 2025-03-10 ENCOUNTER — Ambulatory Visit: Admitting: Podiatry
